# Patient Record
Sex: Female | Born: 1965
Health system: Southern US, Community
[De-identification: ages and names within clinical notes are randomized; demographics above are authoritative.]

## PROBLEM LIST (undated history)

## (undated) DIAGNOSIS — R102 Pelvic and perineal pain: Secondary | ICD-10-CM

## (undated) DIAGNOSIS — G8929 Other chronic pain: Secondary | ICD-10-CM

## (undated) DIAGNOSIS — T8859XA Other complications of anesthesia, initial encounter: Secondary | ICD-10-CM

## (undated) DIAGNOSIS — N3010 Interstitial cystitis (chronic) without hematuria: Secondary | ICD-10-CM

## (undated) DIAGNOSIS — M1990 Unspecified osteoarthritis, unspecified site: Secondary | ICD-10-CM

## (undated) DIAGNOSIS — Z9889 Other specified postprocedural states: Secondary | ICD-10-CM

## (undated) DIAGNOSIS — Z889 Allergy status to unspecified drugs, medicaments and biological substances status: Secondary | ICD-10-CM

## (undated) DIAGNOSIS — G5600 Carpal tunnel syndrome, unspecified upper limb: Secondary | ICD-10-CM

## (undated) DIAGNOSIS — T4145XA Adverse effect of unspecified anesthetic, initial encounter: Secondary | ICD-10-CM

## (undated) DIAGNOSIS — R112 Nausea with vomiting, unspecified: Secondary | ICD-10-CM

## (undated) DIAGNOSIS — F419 Anxiety disorder, unspecified: Secondary | ICD-10-CM

## (undated) HISTORY — PX: REDUCTION MAMMAPLASTY: SUR839

## (undated) HISTORY — PX: CYSTOSCOPY W/ DILATION OF BLADDER: SUR374

## (undated) HISTORY — PX: CYSTOSCOPY WITH HYDRODISTENSION AND BIOPSY: SHX5127

## (undated) HISTORY — PX: ABDOMINAL HYSTERECTOMY: SHX81

## (undated) HISTORY — PX: BREAST REDUCTION SURGERY: SHX8

## (undated) HISTORY — PX: DIAGNOSTIC LAPAROSCOPY: SUR761

## (undated) HISTORY — PX: KNEE ARTHROSCOPY: SHX127

## (undated) HISTORY — PX: ANKLE ARTHROSCOPY: SUR85

---

## 1998-06-02 ENCOUNTER — Emergency Department (HOSPITAL_COMMUNITY): Admission: EM | Admit: 1998-06-02 | Discharge: 1998-06-02 | Payer: Self-pay | Admitting: Internal Medicine

## 1998-06-15 ENCOUNTER — Other Ambulatory Visit: Admission: RE | Admit: 1998-06-15 | Discharge: 1998-06-15 | Payer: Self-pay | Admitting: Obstetrics and Gynecology

## 1998-10-18 ENCOUNTER — Ambulatory Visit (HOSPITAL_BASED_OUTPATIENT_CLINIC_OR_DEPARTMENT_OTHER): Admission: RE | Admit: 1998-10-18 | Discharge: 1998-10-18 | Payer: Self-pay | Admitting: Orthopaedic Surgery

## 1999-10-22 ENCOUNTER — Inpatient Hospital Stay (HOSPITAL_COMMUNITY): Admission: AD | Admit: 1999-10-22 | Discharge: 1999-10-25 | Payer: Self-pay | Admitting: Obstetrics and Gynecology

## 1999-10-26 ENCOUNTER — Encounter: Admission: RE | Admit: 1999-10-26 | Discharge: 2000-01-24 | Payer: Self-pay | Admitting: Obstetrics and Gynecology

## 1999-11-17 ENCOUNTER — Other Ambulatory Visit: Admission: RE | Admit: 1999-11-17 | Discharge: 1999-11-17 | Payer: Self-pay | Admitting: Obstetrics and Gynecology

## 2000-03-05 ENCOUNTER — Ambulatory Visit (HOSPITAL_BASED_OUTPATIENT_CLINIC_OR_DEPARTMENT_OTHER): Admission: RE | Admit: 2000-03-05 | Discharge: 2000-03-05 | Payer: Self-pay | Admitting: Orthopaedic Surgery

## 2000-11-01 ENCOUNTER — Encounter (INDEPENDENT_AMBULATORY_CARE_PROVIDER_SITE_OTHER): Payer: Self-pay

## 2000-11-01 ENCOUNTER — Inpatient Hospital Stay (HOSPITAL_COMMUNITY): Admission: RE | Admit: 2000-11-01 | Discharge: 2000-11-05 | Payer: Self-pay | Admitting: Obstetrics and Gynecology

## 2001-08-05 ENCOUNTER — Encounter: Admission: RE | Admit: 2001-08-05 | Discharge: 2001-08-05 | Payer: Self-pay | Admitting: Obstetrics and Gynecology

## 2001-08-05 ENCOUNTER — Encounter: Payer: Self-pay | Admitting: Obstetrics and Gynecology

## 2001-10-21 ENCOUNTER — Other Ambulatory Visit: Admission: RE | Admit: 2001-10-21 | Discharge: 2001-10-21 | Payer: Self-pay | Admitting: Obstetrics and Gynecology

## 2001-12-02 ENCOUNTER — Ambulatory Visit (HOSPITAL_BASED_OUTPATIENT_CLINIC_OR_DEPARTMENT_OTHER): Admission: RE | Admit: 2001-12-02 | Discharge: 2001-12-02 | Payer: Self-pay | Admitting: Surgery

## 2002-11-15 ENCOUNTER — Encounter: Payer: Self-pay | Admitting: Orthopaedic Surgery

## 2002-11-15 ENCOUNTER — Ambulatory Visit (HOSPITAL_COMMUNITY): Admission: RE | Admit: 2002-11-15 | Discharge: 2002-11-15 | Payer: Self-pay | Admitting: Orthopaedic Surgery

## 2002-11-18 ENCOUNTER — Other Ambulatory Visit: Admission: RE | Admit: 2002-11-18 | Discharge: 2002-11-18 | Payer: Self-pay | Admitting: Obstetrics and Gynecology

## 2003-05-07 ENCOUNTER — Ambulatory Visit (HOSPITAL_BASED_OUTPATIENT_CLINIC_OR_DEPARTMENT_OTHER): Admission: RE | Admit: 2003-05-07 | Discharge: 2003-05-07 | Payer: Self-pay | Admitting: Orthopaedic Surgery

## 2003-05-18 ENCOUNTER — Ambulatory Visit (HOSPITAL_BASED_OUTPATIENT_CLINIC_OR_DEPARTMENT_OTHER): Admission: RE | Admit: 2003-05-18 | Discharge: 2003-05-18 | Payer: Self-pay | Admitting: Urology

## 2003-12-15 ENCOUNTER — Other Ambulatory Visit: Admission: RE | Admit: 2003-12-15 | Discharge: 2003-12-15 | Payer: Self-pay | Admitting: Obstetrics and Gynecology

## 2004-04-28 ENCOUNTER — Ambulatory Visit (HOSPITAL_BASED_OUTPATIENT_CLINIC_OR_DEPARTMENT_OTHER): Admission: RE | Admit: 2004-04-28 | Discharge: 2004-04-28 | Payer: Self-pay | Admitting: Urology

## 2004-09-01 ENCOUNTER — Other Ambulatory Visit: Admission: RE | Admit: 2004-09-01 | Discharge: 2004-09-01 | Payer: Self-pay | Admitting: Obstetrics and Gynecology

## 2005-05-03 ENCOUNTER — Ambulatory Visit: Payer: Self-pay | Admitting: Internal Medicine

## 2005-07-26 ENCOUNTER — Other Ambulatory Visit: Admission: RE | Admit: 2005-07-26 | Discharge: 2005-07-26 | Payer: Self-pay | Admitting: Obstetrics and Gynecology

## 2005-11-20 ENCOUNTER — Ambulatory Visit (HOSPITAL_COMMUNITY): Admission: RE | Admit: 2005-11-20 | Discharge: 2005-11-20 | Payer: Self-pay | Admitting: Urology

## 2005-11-20 ENCOUNTER — Ambulatory Visit (HOSPITAL_BASED_OUTPATIENT_CLINIC_OR_DEPARTMENT_OTHER): Admission: RE | Admit: 2005-11-20 | Discharge: 2005-11-20 | Payer: Self-pay | Admitting: Urology

## 2006-01-08 ENCOUNTER — Ambulatory Visit: Payer: Self-pay | Admitting: Internal Medicine

## 2006-01-24 ENCOUNTER — Ambulatory Visit: Payer: Self-pay | Admitting: Internal Medicine

## 2006-06-10 ENCOUNTER — Ambulatory Visit: Payer: Self-pay | Admitting: Internal Medicine

## 2006-11-29 ENCOUNTER — Ambulatory Visit (HOSPITAL_BASED_OUTPATIENT_CLINIC_OR_DEPARTMENT_OTHER): Admission: RE | Admit: 2006-11-29 | Discharge: 2006-11-29 | Payer: Self-pay | Admitting: Urology

## 2006-11-29 ENCOUNTER — Ambulatory Visit: Payer: Self-pay | Admitting: Internal Medicine

## 2007-04-21 ENCOUNTER — Inpatient Hospital Stay (HOSPITAL_COMMUNITY): Admission: RE | Admit: 2007-04-21 | Discharge: 2007-04-23 | Payer: Self-pay | Admitting: Obstetrics and Gynecology

## 2007-05-30 ENCOUNTER — Ambulatory Visit: Payer: Self-pay | Admitting: Internal Medicine

## 2007-09-11 ENCOUNTER — Ambulatory Visit: Payer: Self-pay | Admitting: Internal Medicine

## 2007-11-04 ENCOUNTER — Ambulatory Visit (HOSPITAL_BASED_OUTPATIENT_CLINIC_OR_DEPARTMENT_OTHER): Admission: RE | Admit: 2007-11-04 | Discharge: 2007-11-04 | Payer: Self-pay | Admitting: Urology

## 2007-12-15 ENCOUNTER — Encounter: Admission: RE | Admit: 2007-12-15 | Discharge: 2007-12-15 | Payer: Self-pay | Admitting: Obstetrics and Gynecology

## 2008-03-15 ENCOUNTER — Emergency Department (HOSPITAL_COMMUNITY): Admission: EM | Admit: 2008-03-15 | Discharge: 2008-03-15 | Payer: Self-pay | Admitting: Emergency Medicine

## 2009-01-25 ENCOUNTER — Encounter: Admission: RE | Admit: 2009-01-25 | Discharge: 2009-01-25 | Payer: Self-pay | Admitting: Neurological Surgery

## 2009-04-19 ENCOUNTER — Encounter: Admission: RE | Admit: 2009-04-19 | Discharge: 2009-04-19 | Payer: Self-pay | Admitting: Obstetrics and Gynecology

## 2009-11-25 ENCOUNTER — Encounter: Admission: RE | Admit: 2009-11-25 | Discharge: 2009-11-25 | Payer: Self-pay

## 2010-03-29 ENCOUNTER — Ambulatory Visit: Payer: Self-pay

## 2010-05-30 ENCOUNTER — Encounter: Admission: RE | Admit: 2010-05-30 | Discharge: 2010-05-30 | Payer: Self-pay | Admitting: Gastroenterology

## 2011-05-01 NOTE — Assessment & Plan Note (Signed)
[facility]                             PULMONARY OFFICE NOTE   NAME:Kelly Garcia, Kelly Garcia                       MRN:          [id number]  DATE:05/30/2007                            DOB:          18-Feb-1966    PROBLEMS:  1. Allergic rhinitis.  2. Asthma.   HISTORY:  She comes for one year follow up continuing on allergy vaccine  with no problems. She strongly believes the vaccine helps her. Dose was  built to 0.3 ml per week of 1-10. Higher amount did cause some local  reaction. Her husband gives her shots. Today, I reviewed again risk and  benefit considerations, issues of administration outside of a medical  office, anaphylaxis, and epinephrine. We refilled her epinephrine  prescription. She indicated good understanding. Occasionally, she has  had a very brief self-limited episode of cough, especially in the  evenings before bed. We discussed whether this might be asthma,  aspiration, or some other event, but it is too infrequent and self-  limited to judge from her description.   MEDICATIONS:  1. Allergy vaccine.  2. Zyrtec.  3. Singulair.  4. Hydroxyzine for her bladder.  5. Neurontin.  6. BuSpar.  7. Nasonex.  8. Prozac.  9. Elmiron.   Drug intolerant DOXYCYCLINE, MORPHINE with itching, ALL NARCOTICS.   OBJECTIVE:  Weight 140 pounds, blood pressure 128/84, pulse 101, room  air saturation 98%. She seems calm and comfortable. Conjunctivae and  nasal airway are clear. Lungs are clear with good airflow. Heart sounds  are normal   IMPRESSION:  Allergic rhinitis and possible minimal intermittent asthma  under good control.   PLAN:  Educational discussion as above. Epinephrine refilled. Sample  albuterol inhaler 2 puffs daily p.r.n. She will watch the effect of this  and decide whether we need to keep her refilled. Schedule return one  year, earlier p.r.n.     Clinton D. Maple Hudson, MD, Tonny Bollman, FACP  Electronically Signed    CDY/MedQ  DD:  05/31/2007  DT: 06/01/2007  Job #: [id number]   cc:   Gwen Pounds, MD

## 2011-05-01 NOTE — Op Note (Signed)
Kelly Garcia, Kelly Garcia                ACCOUNT NO.:  [id number]   MEDICAL RECORD NO.:  [id number]          PATIENT TYPE:  AMB   LOCATION:  [facility]                         FACILITY:  [facility]   PHYSICIAN:  Jamison Neighbor, M.D.  DATE OF BIRTH:  [birth year]   DATE OF PROCEDURE:  11/04/2007  DATE OF DISCHARGE:                               OPERATIVE REPORT   PREOPERATIVE DIAGNOSES:  1. Interstitial cystitis.  2. Urgency incontinence.   POSTOPERATIVE DIAGNOSES:  1. Interstitial cystitis.  2. Urgency incontinence.   PROCEDURE:  Cystoscopy, urethral calibration, hydrodistention of the  bladder, Marcaine and Pyridium installation, Marcaine and Kenalog  injection, Botox injection.   SURGEON:  Jamison Neighbor, M.D.   ANESTHESIA:  General.   COMPLICATIONS:  None.   DRAINS:  None.   BRIEF HISTORY:  This 45 year old female has voiding dysfunction as well  as interstitial cystitis as evidenced by previous cystoscopy and  hydrodistention.  The patient has had a worsening of her symptoms, has  requested repeat hydrodistention.  We have had some discussion about  adding a Botox injection to try to stop her very difficult to control  frequency and urgency.  The patient gave some serious thought to that  and decided to have a Botox injection.  She is aware of the potential  risk for retention problems but feels that she will be able to handle  this if that happens.  She gave full informed consent.   PROCEDURE:  After successful induction of general anesthesia, the  patient was placed in the dorsal lithotomy position, prepped with  Betadine and draped in the usual sterile fashion.  Careful bimanual  examination revealed no cystocele, rectocele or enterocele.  There were  no masses on bimanual exam.  Urethra was calibrated 32-French with  female urethral sounds with no evidence of stenosis or stricture.  The  cystoscope was inserted.  The bladder was carefully inspected.  No  tumors or stones could  be seen.  The bladder had a slightly trabeculated  look as if the patient had been pushing against some resistance and  clearly we will want to explore with her further the possibility of  ongoing pelvic floor rehabilitation or possible use of Valium  suppositories.  The bladder was filled.  The patient had a pressure of  100 cm of water.  She had bladder capacity after anesthetic dilation of  600 mL with no bleeding.  The patient underwent Botox injection with  total of two ampules, she then had Marcaine and Pyridium instilled the  bladder.  Marcaine and Kenalog were injected periurethrally as a  pudendal block.  The patient tolerated procedure  well, was taken to recovery in good condition.  She will be sent home  with Lorcet Plus, Pyridium Plus and doxycycline and return to see me in  follow-up.  If she is unable to urinate, she will be taught in-and-out  catheterization or given a short course of an indwelling catheter.      Jamison Neighbor, M.D.  Electronically Signed     RJE/MEDQ  D:  11/04/2007  T:  11/04/2007  Job:  [id number]

## 2011-05-04 NOTE — Op Note (Signed)
NAMELILOU, KNEIP                          ACCOUNT NO.:  [id number]   MEDICAL RECORD NO.:  [id number]                   PATIENT TYPE:  AMB   LOCATION:  [facility]                                  FACILITY:  [facility]   PHYSICIAN:  Lubertha Basque. Jerl Santos, M.D.             DATE OF BIRTH:  1966-02-07   DATE OF PROCEDURE:  05/07/2003  DATE OF DISCHARGE:                                 OPERATIVE REPORT   PREOPERATIVE DIAGNOSIS:  Right knee torn medial meniscus.   POSTOPERATIVE DIAGNOSIS:  Right knee torn medial meniscus.   PROCEDURE:  1. Right knee partial meniscectomy.  2. Right knee arthroscopic chondroplasty.   SURGEON:  Lubertha Basque. Jerl Santos, M.D.   ASSISTANT:  Lindwood Qua, P.A.   INDICATIONS:  The patient is a 45 year old woman who is about 5 years out  from ACL reconstruction where at which point a medial meniscus repair was  performed.  Unfortunately, she was shopping a few days ago, when she came  down off a shelf and twisted her knee.  She felt a pop and has had extreme  pain since that time.  An MRI scan was done two days ago which showed a  displaced medial meniscus tear with an intact ACL reconstruction graft.  She  is offered an arthroscopy at this point for a locked knee which was stuck in  less than full extension.  Informed operative consent was obtained after  being advised of possible complications of, reaction to anesthesia and  infection.   DESCRIPTION OF PROCEDURE:  The patient was taken to the operative suite  where general anesthetic was applied without difficulty.  She was positioned  supine, prepped and draped in the normal sterile fashion.  After the  administration of preoperative antibiotics, an arthroscopy of the right knee  was performed through two inferior portals.  Suprapatellar fossa was benign  as was the patellofemoral joint.  She had mild chondromalacia addressed with  a brief chondroplasty.  In the medial compartment, she did have a displaced  locked  bucket handle tear which appeared to have occurred at her old repair  site.  I elective to remove this portion of her meniscus taking about 50% of  the medial meniscus in the process.  The ACL graft appeared intact.  The PCL  was intact.  Lateral compartment revealed no evidence of meniscal or  articular cartilage injury.  There were no significant degenerative changes  in the medial compartment.  The knee was thoroughly irrigated at the end of  the case followed by placement of Marcaine with epinephrine and morphine.  Adaptic was placed over the portals followed by dry gauze and a loose Ace  wrap.  Estimated blood loss and intraoperative fluids can be obtained from  the anesthesia records.   DISPOSITION:  The patient was extubated in the operating room and taken to  the recovery room stable.  Plans were for her  to stay overnight for pain  control and probable discharge home in the morning.                                               Lubertha Basque Jerl Santos, M.D.    PGD/MEDQ  D:  05/07/2003  T:  05/07/2003  Job:  [id number]

## 2011-05-04 NOTE — Op Note (Signed)
[facility]  Patient:    Kelly Garcia, Kelly Garcia                       MRN: [id number] Proc. Date: 03/05/00 Adm. Date:  [id number] Attending:  Marcene Corning                           Operative Report  PREOPERATIVE DIAGNOSIS:  Right knee torn medial meniscus.  POSTOPERATIVE DIAGNOSIS: 1. Right knee torn medial meniscus. 2. 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  OPERATION PERFORMED: 1. Right knee medial meniscus repair. 2. Right knee chondroplasty patella and removal of loose bodies.  ANESTHESIA:  General.  ATTENDING SURGEON:  Lubertha Basque. Jerl Santos, M.D.  ASSISTANT:  Lindwood Qua, P.A.  INDICATIONS FOR PROCEDURE:  The patient is a 45 year old woman who is about a year and a half from right anterior cruciate ligament reconstruction repeat.  She has persisted with medial jointline pain for eight or nine months.  At one point she had an effusion.  At this point she was offered operative intervention to consist of an arthroscopy.  The procedure was discussed with the patient and informed operative consent was obtained after discussion of possible complications of reaction to anesthesia and infection.  DESCRIPTION OF PROCEDURE:  The patient was taken to an operating suite where general anesthetic was applied without difficulty.  She was then positioned supine and prepped and draped in normal sterile fashion.  After administration of preop intravenous antibiotics, an arthroscopy of the right knee was performed through two inferior portals.  Suprapatellar pouch was benign except for some cartilaginous  loose bodies which were removed.  Her patellofemoral joint exhibited some lateral tracking with some chondromalacia of the medial patellar facet.  This was addressed with a thorough chondroplasty.  Both gutters were notable for some cartilaginous loose bodies which were removed.  The notch showed an intact ACL reconstruction  with  an intact PCL as well.  The lateral compartment exhibited no evidence of meniscal or articular cartilage injury.  In the medial compartment, she had a meniscus tear involving the middle  and a portion of the posterior horn.  This as at the red-white junction and was displaceable.  This was full thickness from the superior to the inferior aspect of the meniscus.  It was felt best to repair this especially in someone who was status post ACL reconstruction with some mild residual laxity.  As a result, we placed a shaver in the back along with an abrader to freshen the area and encourage some bleeding.  We used a zone specific cannula to place three sutures under arthroscopic guidance through an additional posteromedial 1 cm incision.  These were all passed out through this incision and then tied over the capsule in the posteromedial aspect of the knee joint.  This  reapproximated the meniscus to the bleeding capsule.  We then thoroughly irrigated the knee.  On repeat examination, the meniscus could not be displaced and appeared to be in good position for healing.  The knee was irrigated once again at the end of the case followed by placement of Marcaine with epinephrine and morphine. Simple sutures of nylon were used to reapproximate the posteromedial incisions.  Adaptic was placed over all wounds followed by dry gauze and a loose Ace wrap. A knee immobilizer was also applied with the knee in extension.  Estimated blood oss and intraoperative fluids can be obtained  from Anesthesia records.  DISPOSITION:  The patient was extubated in the operating room and taken to the recovery room in stable condition.  Plans were for her to go home the same day nd to follow up in the office in less than a week.  I will contact her by phone tonight. DD:  03/05/00 TD:  03/05/00 Job: [id number]

## 2011-05-04 NOTE — Op Note (Signed)
[name], Kelly Garcia                ACCOUNT NO.:  [id number]   MEDICAL RECORD NO.:  [id number]          PATIENT TYPE:  AMB   LOCATION:  [facility]                           FACILITY:  [facility]   PHYSICIAN:  Michelle L. Grewal, M.D.DATE OF BIRTH:  1966-04-10   DATE OF PROCEDURE:  04/21/2007  DATE OF DISCHARGE:                               OPERATIVE REPORT   PREOPERATIVE DIAGNOSIS:  Pelvic pain, interstitial cystitis, and desires  scar revision.   POSTOPERATIVE DIAGNOSIS:  Pelvic pain, interstitial cystitis, and  desires scar revision.   PROCEDURE:  Is exploratory laparotomy and lysis of adhesions.   SURGEON:  Dr. Vincente Poli   ANESTHESIA:  General.   ESTIMATED BLOOD LOSS:  Minimal.   PROCEDURE:  Patient taken to the operating room after informed consent  is obtained.  She was initially taken to the operating room by Dr.  Benna Dunks. She is intubated.  She is prepped and draped by Dr. Benna Dunks. He  then made the initial skin incision down to the fascia. At that point I  scrubbed in and I made a small low transverse incision in the fascia and  scored it laterally and divided the rectus muscles in the midline and  the peritoneum bluntly. I then placed two packs in the upper abdomen,  pushed the bowel in the upper abdomen so that I could palpate inspect  the pelvis.  The ovaries were very small. She is status post  hysterectomy and of course there were no adhesions to the vaginal cuff.  The left ovary was adherent slightly to the peritoneum, was high up in  the pelvis and so I did loosen that up so that it was more free.  No  adhesions to the vaginal cuff or the bladder which was good.  We removed  the laparotomy pads, closed the peritoneum using 0 Vicryl and then  reapproximated in the rectus muscles using 0 Vicryl.  I then closed the  fascia using 0 Vicryl running stitch and then at that point I scrubbed  out and Dr. Benna Dunks completed his procedure. All sponge, lap and  instrument counts were correct  x2.  The patient went to recovery room in  stable condition.      Michelle L. Vincente Poli, M.D.  Electronically Signed     MLG/MEDQ  D:  04/21/2007  T:  04/21/2007  Job:  [id number]

## 2011-05-04 NOTE — Op Note (Addendum)
Kelly Garcia, DARNOLD                          ACCOUNT NO.:  [id number]   MEDICAL RECORD NO.:  [id number]                   PATIENT TYPE:  AMB   LOCATION:  [facility]                                 FACILITY:  [facility]   PHYSICIAN:  Jamison Neighbor, M.D.               DATE OF BIRTH:  November 09, 1966   DATE OF PROCEDURE:  05/18/2003  DATE OF DISCHARGE:                                 OPERATIVE REPORT   PREOPERATIVE DIAGNOSIS:  Interstitial cystitis.   POSTOPERATIVE DIAGNOSIS:  Interstitial cystitis.   PROCEDURE:  Cystoscopy, urethral calibration, hydrodistention of bladder,  Marcaine __________, Marcaine and Kenalog injection   SURGEON:  Jamison Neighbor, M.D.   ANESTHESIA:  General.   COMPLICATIONS:  None.   DRAINS:  None.   BRIEF HISTORY:  This is a 45 year old female who has severe painful bladder  consistent with interstitial cystitis.  The patient has responded nicely to  hydrodistention in the past and has requested this be repeated.  She is  aware of the fact that there is no guarantee that this will work for her as  well as it has in the past and she feels that she has now responded to oral  therapy as well as she had in the past and she has not responded to  intravesical therapy and for that reason she has requested hydrodistention.  She understands all the risks and benefits of the procedure and gave full  and informed consent.   DESCRIPTION OF PROCEDURE:  After the successful induction of general  anesthesia, the patient was placed in the dorsal lithotomy position and  prepped with Betadine and draped in the usual sterile fashion.  Bimanual  examination revealed no abnormalities of the urethra, bladder base or  vaginal contents.  There were no adnexal masses, there was no cystocele,  rectocele, enterocele, or urethral diverticulum.  The urethra was calibrated  to 74 [place] with urethral sounds with no stenosis or stricture identified.  The cystoscope was inserted and the  bladder was carefully inspected.  It was  free of any tumor or stones and both ureteral orifices were normal in  configuration and location.  The bladder was then distended at a pressure of  100 cm of water for five minutes and when the bladder was drained, very  little in the way of _________was seen.  This was in marked contrast to  previous hydrodistentions and would suggest that there has been a good  response to the Elmiron therapy.  The bladder capacity was 850 cc which is  diminished when compared with a normal capacity of 1150 and an interstitial  cystitis average of 575.  The bladder was drained.  A mixture of Marcaine  and __________ was left in the bladder, Marcaine and Kenalog were injected  periurethrally as well as a bilateral pudendal block.  The patient received  intraoperative Toradol and Zofran as well as a ____  QA MARKER: 123                                              ____suppository.  She will be sent home with Lorcet 10, Pyri  Levaquin, and return to see me in followup in two to three weeks' time.                                              Jamison Neighbor, M.D.   RJE/MEDQ  D:  05/18/2003  T:  05/18/2003  Job:  [id number]

## 2011-05-04 NOTE — Op Note (Signed)
[facility]  Patient:    Kelly Garcia, Kelly Garcia Visit Number: [id number] MRN: [id number]          Service Type: NES Location: [facility] Attending Physician:  Londell Moh Dictated by:   Jamison Neighbor, M.D. Proc. Date: 12/02/01 Admit Date:  12/02/2001                             Operative Report  SERVICE:  Urology.  PREOPERATIVE DIAGNOSIS:  Interstitial cystitis.  POSTOPERATIVE DIAGNOSIS:  Interstitial cystitis.  PROCEDURE:  Cystoscopy, urethral calibration, hydrodistention of the bladder, Marcaine and Pyridium installation, Marcaine and Kenalog injection.  SURGEON:  Jamison Neighbor, M.D.  COMPLICATIONS:  None.  DRAINS:  None.  BRIEF HISTORY:  This 45 year old female was known to have interstitial cystitis. The patient has responded beautifully to hydrodistention in the past and feels that this is not only a diagnostic but a therapeutic maneuver for her. The patients symptoms have flared up and she has not responded to other forms of therapy and has requested a repeat hydrodistention be performed. The patient understands that there is no guarantee that she will have a decrease in her symptoms and that specifically she may have increased urgency, frequency, and pain for at least one to two weeks postoperatively. The patient gave full and informed consent.  DESCRIPTION OF PROCEDURE:  After successful induction of general anesthesia, the patient was placed in the dorsal lithotomy position, prepped with Betadine and draped in the usual sterile fashion. Careful bimanual examination revealed no cystocele, rectocele, or enterocele. There are no masses on bimanual exam. The patient had no evidence of a urethral diverticulum. The patient is status post hysterectomy. The urethra was calibrated at 87 [place] with female urethral sounds with no signs of stenosis or stricture. The cystoscope was inserted, the bladder was carefully inspected and was free of  any tumor or stones. Both ureteral orifices were normal in configuration and location. The bladder was then distended at a pressure of 100 cm of water for 5 minutes. When the bladder was drained, the patient had glomerulations scattered throughout the bladder. The bladder capacity was slightly diminished at 800 cc consistent with a moderate case of interstitial cystitis. The patient does not require biopsy at this time. The bladder was drained, a mixture of Marcaine and Pyridium was instilled in the bladder, Marcaine and Kenalog were injected periurethrally. The patient was given a B&O suppository and was given intraoperative Toradol and Zofran. She will be given appropriate pain medication in the postoperative period and will be sent home with lorcet plus, Pyridium plus and Levaquin. She will be seen in the office in two to three weeks time. Dictated by:   Jamison Neighbor, M.D. Attending Physician:  Londell Moh DD:  12/02/01 TD:  12/02/01 Job: [id number]

## 2011-05-04 NOTE — Op Note (Signed)
Kelly Garcia, Kelly Garcia                ACCOUNT NO.:  [id number]   MEDICAL RECORD NO.:  [id number]          PATIENT TYPE:  AMB   LOCATION:  [facility]                         FACILITY:  [facility]   PHYSICIAN:  Jamison Neighbor, M.D.  DATE OF BIRTH:  1966-08-06   DATE OF PROCEDURE:  11/29/2006  DATE OF DISCHARGE:                               OPERATIVE REPORT   PREOPERATIVE DIAGNOSIS:  Interstitial cystitis.   POSTOPERATIVE DIAGNOSIS:  Interstitial cystitis.   PROCEDURES:  1. Cystoscopy.  2. Urethral calibration.  3. Hydrodistention of the bladder.  4. Marcaine and Pyridium installation.  5. Marcaine and Kenalog injection.   SURGEON:  Jamison Neighbor, M.D.   ANESTHESIA:  General.   COMPLICATIONS:  None.   DRAINS:  None.   BRIEF HISTORY:  This 45 year old female has problems with urgency  incontinence secondary to interstitial cystitis.  She has severe and  chronic pelvic pain; and requires a repeat hydrodistention.  The patient  has been a major flare since she underwent some manipulation of her  Pfannenstiel incision by a massage therapist.  This caused some real  exacerbation of her bladder pain; and it very difficult to control.  She  is now to undergo cystoscopy and hydrodistention.  She understands the  risks and benefits of the procedure and gave full and informed consent.   DESCRIPTION OF PROCEDURE:  After successful induction of general  anesthesia the patient was placed in the dorsal position, prepped with  Betadine and draped in the usual sterile fashion.  Careful bimanual  examination revealed an unremarkable urethra. It was pre well-supported.  There was a minimal cystocele, no real rectocele or enterocele; and no  significant vault prolapse.  The urethra was calibrated to 32-French  with female urethral sounds with no evidence of stenosis or stricture.  The cystoscope was inserted, the bladder was carefully inspected.  Both  ureteral orifices were normal in configuration  and location.  There was  pretty significant trabeculation consistent with some ongoing pelvic  floor obstruction, but there was no evidence of diverticular formation.  Hydrodistention of the bladder was performed.  The bladder was distended  at a pressure of 100 cm of water for 5 minutes.  When the bladder was  drained.  A minimal number of glomerulations could be seen.  This was a  better appearance than what I had been seeing previously, but the  patient did have a bladder capacity of just over 600 mL.  This compares  to a normal bladder capacity of 1150; and an average IC bladder capacity  of approximately 575.  The bladder was drained.  A mixture of Marcaine  and Pyridium was left within the bladder.  A mixture of Marcaine and  Kenalog was  injected periurethrally.  The patient tolerated the procedure well and  was taken to the recovery room in good condition.  She will be sent home  with Lorcet Plus, Pyridium Plus and Macrobid.  She will return to see me  in several weeks time.           ______________________________  Jamison Neighbor,  M.D.  Electronically Signed     RJE/MEDQ  D:  11/29/2006  T:  11/29/2006  Job:  [id number]

## 2011-05-04 NOTE — Op Note (Signed)
[facility]  Patient:    Kelly Garcia, Kelly Garcia                         MRN: [id number] Proc. Date: 11/01/00 Attending:  Marcelle Overlie, M.D.                           Operative Report  PREOPERATIVE DIAGNOSES:       1. Chronic cervicitis.                               2. Pelvic pain.  POSTOPERATIVE DIAGNOSES:      1. Chronic cervicitis.                               2. Pelvic pain.  PROCEDURE:                    Total vaginal hysterectomy.  SURGEON:                      Marcelle Overlie, M.D.  ASSISTANT:                    Luvenia Redden, M.D.  ANESTHESIA:                   General anesthesia.  ESTIMATED BLOOD LOSS:         50 cc.  FINDINGS:                     A small uterus with normal ovaries.  COMPLICATIONS:                None.  PATHOLOGY:                    Uterus and cervix.  DESCRIPTION OF PROCEDURE:     The patient is taken to the operating room.  She was intubated without difficulty.  The patient was placed in a high lithotomy position.  The vagina and vulva were prepped and draped in the usual sterile fashion.  In and out catheter was used to empty the bladder.  A speculum was placed in the vagina.  The cervix was grasped with the tenaculum and the cervix was infiltrated paracervically.  A circumferential incision was then made with the electrocautery and the overlying vaginal epithelium was then pushed away anteriorly and laterally and posteriorly.  The posterior cul-de-sac was visualized and entered sharply using the Mayo scissors.  The posterior peritoneum was then fixed to the posterior cul-de-sac using a stitch,  using #0 Vicryl suture.  We then turned out attention anteriorly and used a sharp dissection.  The anterior cul-de-sac was then entered without difficulty, and using a Heaney clamp, the uterosacral and Cardinal ligaments were then clamped initially on the left side, and the pedicles were cut and suture ligated using #0  Vicryl sutures.  In a likewise fashion the uterosacral and Cardinal ligament complex was then clamped on the right side using a Heaney clamp.  The pedicles were cut and suture ligated using #0 Vicryl suture.  The uterine arteries were then subsequently clamped on each side using Heaney clamps, and the pedicles were cut and suture ligated using #0 Vicryl suture, then progressively clamped along the broad ligament using Heaney clamps.  Each pedicle was cut and suture ligated using #0 Vicryl suture.  Once we had reached the level of the triple pedicle, the uterus was then retroflexed and a Heaney clamp was placed across each triple pedicle quite easily.  The pedicles were then cut and then secured using a suture ligature of #0 Vicryl suture, as well as a free tie of #0 Vicryl suture.  All pedicles were inspected and noted to be hemostatic.  The cul-de-sac was noted to be a little deep, and so a McCall culdoplasty was performed using #0 Vicryl sutures, starting at one vaginal corner, and then incorporating the uterosacrals and the peritoneum from the cul-de-sac.  The cuff was then closed using #0 Vicryl suture in a continuous running locking fashion, and a cul-de-sac stitch was tied at the end.  A Foley catheter was placed at the end of the procedure and it was noted that there was clear urine draining from the Foley catheter.  All sponge, lap, and instrument counts were correct x 2.  The patient tolerated the procedure well and was removed to the recovery room in stable condition. DD:  11/01/00 TD:  11/01/00 Job: [id number]

## 2011-05-04 NOTE — Discharge Summary (Signed)
NAMEMIKENZIE, Kelly Garcia                ACCOUNT NO.:  [id number]   MEDICAL RECORD NO.:  [id number]          PATIENT TYPE:  INP   LOCATION:  9315                          FACILITY:  [facility]   PHYSICIAN:  Kelly Garcia, M.D.DATE OF BIRTH:  November 29, 1966   DATE OF ADMISSION:  04/21/2007  DATE OF DISCHARGE:  04/23/2007                               DISCHARGE SUMMARY   ADMISSION DIAGNOSIS:  Pelvic pain and unsightly scar from previous C-  section incision   HOSPITAL COURSE:  The patient is a 45 year old female who was admitted  for minilaparotomy and revision of C-section scar.  She has a history of  chronic pelvic pain and significant interstitial for cystitis.  On the  day of surgery, the patient underwent her exploratory laparotomy and  lysis of adhesions and scar revision.  EBL was minimal.  She had some  pain issues postop, mostly likely because of her history of interstitial  cystitis.  Her hemoglobin on postop day #1 was 11.5.  She went home on  postop day #2 in good condition.  She was given Dilaudid to take as  needed for pain control.  She will follow up with Dr. Benna Dunks for her  postop visit and follow up with me in one week.  She was advised no  heavy lifting for 4-6 weeks, no sex for 2-3 weeks, no driving for one  week and to call if she has any nausea, vomiting, abdominal pain,  temperature greater than 100.5 or redness or drainage from incision  site.      Kelly L. Vincente Poli, M.D.  Electronically Signed     MLG/MEDQ  D:  06/07/2007  T:  06/08/2007  Job:  [id number]

## 2011-05-04 NOTE — Op Note (Signed)
[facility]  PatientSARANYA Garcia                        MRN: [id number] Proc. Date: 10/22/99 Adm. Date:  [id number] Attending:  Marcelle Overlie                           Operative Report  PREOPERATIVE DIAGNOSIS:       Intrauterine pregnancy at 37-1/7 weeks.  Previous  cesarean section.  Spontaneous rupture of membranes.  POSTOPERATIVE DIAGNOSIS:      Intrauterine pregnancy at 37-1/7 weeks.  Previous  cesarean section.  Spontaneous rupture of membranes.  OPERATION:                    Repeat low transverse cesarean section.  SURGEON:                      Marcelle Overlie, M.D.  ASSISTANT:  ANESTHESIA:                   Spinal.  ESTIMATED BLOOD LOSS:         500 cc.  FINDINGS:                     Vigorous female infant with Apgars of 9 at one minute and 9 at five minutes weighing 7 pounds 4 ounces.  DESCRIPTION OF PROCEDURE:     The patient was taken to the operating room where she was given a spinal without difficulty.  She was then placed in the supine position with a dorsal leftward tilt.  The abdomen was prepped and draped in the usual sterile fashion after a Foley catheter was placed in the bladder.  Using a scalpel, a low transverse incision was made and carried down to the fascia using electrocautery with good hemostasis.  The fascia was scored in the midline and hen extended laterally using Mayo scissors.  The fascia was then grasped and a Pfannenstiel incision was created by dissecting the rectus muscles away superiorly and then inferiorly with good hemostasis.  The midline was identified, the rectus muscles were separated.  The peritoneum was picked up and entered bluntly. There were no adhesions to the anterior abdominal wall.  The peritoneal incision was hen stretched and then extended.  The bladder blade was inserted.  The lower uterine segment was identified.  The bladder flap was created sharply and  then digitally and the bladder blade was readjusted.  Using the scalpel, a low transverse incision was made in the uterus and then extended laterally using Mayo scissors.  There as clear amniotic fluid noted upon entry into the uterine cavity.  The baby was in the cephalic presentation and was delivered with the assistance of vacuum extractor. It was a vigorous female with Apgars of 9 at one minute and 9 at five minutes. It weighed 7 pounds 4 ounces.  The cord was clamped and cut.  The baby was handed o the awaiting pediatricians and subsequently taken to the newborn nursery.  Cord  blood was obtained and the placenta was manually removed and noted to be intact. The uterus was then cleared of all clots and debris.  The uterine incision was closed in a single layer using 0 chromic in a continuous running locking stitch and was noted to be hemostatic.  The adnexa were palpated and noted to be normal.  he peritoneum was closed in a single layer using 0 Vicryl in a continuous running stitch.  The rectus muscles were reapproximated using the same 0 Vicryl.  After  noting that the rectus muscles were hemostatic, the fascia was close dusing 0 Vicryl starting at each corner using a continuous running stitch and meeting in the midline.  After irrigation of the subcutaneous layer, the skin was closed with staples.  Sponge, needle, and instrument counts were correct x 2.  The patient tolerated the procedure well and went to the recovery room in stable condition.  Drains Foley.  Pathology none. DD:  10/22/99 TD:  10/23/99 Job: [id number]

## 2011-05-04 NOTE — Op Note (Signed)
NAMEAUNDRIA, BITTERMAN                ACCOUNT NO.:  [id number]   MEDICAL RECORD NO.:  [id number]          PATIENT TYPE:  AMB   LOCATION:  NESC                         FACILITY:  [facility]   PHYSICIAN:  Jamison Neighbor, M.D.  DATE OF BIRTH:  [birth year]   DATE OF PROCEDURE:  11/20/2005  DATE OF DISCHARGE:                                 OPERATIVE REPORT   SERVICE:  Urology.   PREOPERATIVE DIAGNOSES:  Interstitial cystitis.   POSTOPERATIVE DIAGNOSES:  Interstitial cystitis.   PROCEDURE:  Cystoscopy, urethral calibration, hydrodistention of the  bladder, Marcaine and Pyridium installation, Marcaine and Kenalog injection.   SURGEON:  Jamison Neighbor, M.D.   ANESTHESIA:  General.   COMPLICATIONS:  None.   DRAINS:  None.   BRIEF HISTORY:  This 45 year old female has known interstitial cystitis. She  is now to undergo hydrodistention. She has had good responses in the past.  She understands there is no guarantee she will have the same improvement  that she has had before. She gave full informed consent.   DESCRIPTION OF PROCEDURE:  After successful induction of general anesthesia,  the patient was placed in the dorsal lithotomy position, prepped with  Betadine and draped in the usual sterile fashion. Careful bimanual  examination revealed no significant cystocele, rectocele or enterocele.  There were no masses on bimanual exam. The patient's urethra was of normal  caliber accepting a 27 [place] female urethral sound with no evidence of  stenosis or stricture. The cystoscope was inserted, the bladder was  carefully inspected and was free of any tumor or stones. Both ureteral  orifices were normal in configuration and location. Hydrodistention of the  bladder was then performed. The bladder was distended at a pressure of 100  cmH2O for 5 minutes and when the bladder was drained glomerulations were  seen throughout the bladder. The bladder capacity was 700 mL, no ulcers  could be seen, no  other abnormalities were detected. The bladder was  drained, the patient tolerated the procedure well. She received Marcaine and  Pyridium installation, Marcaine and Kenalog injection. She will return to  see me in followup. She will be sent home with pain medication, Pyridium  plus and antibiotic coverage.          ______________________________  Jamison Neighbor, M.D.  Electronically Signed    RJE/MEDQ  D:  11/20/2005  T:  11/20/2005  Job:  [id number]
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
## 2011-05-04 NOTE — Discharge Summary (Signed)
[facility]  Patient:    Kelly Garcia, Kelly Garcia                       MRN: [id number] Adm. Date:  [id number] Disc. Date: [id number] Attending:  Marcelle Overlie                           Discharge Summary  ADMISSION DIAGNOSES:          Chronic cervicitis and pelvic pain.  DISCHARGE DIAGNOSES:          Chronic cervicitis and pelvic pain, status post total vaginal hysterectomy.  HOSPITAL COURSE:              Patient is a 45 year old gravida 3, para 2 with chronic cervicitis and pelvic pain.  She also complains of heavier menses since the birth of her last child.  She was treated with antibiotics numerous times and cryo without any relief.  Patient did not want to try further antibiotics.  She also complains of severe dyspareunia.  Patient was admitted, underwent a total vaginal hysterectomy.  EBL at the time of surgery was 50 cc. Patient did very well immediately postoperative.  By postoperative day #2 was tolerating a regular diet, ambulating.  Did develop a postoperative temperature of 102.1 on postoperative day #1-2.  Her white blood cell count remained normal.  On the day after surgery it was 6.8.  Her white blood cell count on postoperative day #2 was 7.9 and postoperative day #3 was 8.1. Patients hematocrit immediately postoperative on postoperative day #1 was 31.2 and remained stable throughout hospitalization.  It was felt the patients postoperative temperature was secondary to pelvic cellulitis and patient was started on antibiotics and within 48 hours of antibiotics had defervesced and was feeling much better.  She went home on postoperative day #4.  She was given Augmentin 250 mg p.o. t.i.d. for seven days as well as a prescription of Tylox and Motrin.  She will follow up in the office in four weeks.  She was advised to call if she has a temperature greater than 100.5, any nausea, vomiting, vaginal drainage, or increasing abdominal pain.  She was in  good condition at the time of discharge.  She was advised no driving for two to three weeks and no heavy lifting for four weeks. DD:  12/18/00 TD:  12/18/00 Job: [id number]

## 2011-05-04 NOTE — Op Note (Signed)
Kelly Garcia, Kelly Garcia                ACCOUNT NO.:  [id number]   MEDICAL RECORD NO.:  [id number]          PATIENT TYPE:  AMB   LOCATION:  SDC                           FACILITY:  [facility]   PHYSICIAN:  Alfredia Ferguson, M.D.  DATE OF BIRTH:  [birth year]   DATE OF PROCEDURE:  DATE OF DISCHARGE:                               OPERATIVE REPORT   PREOPERATIVE DIAGNOSIS:  Unsightly scar from previous C-section  incision.   POSTOPERATIVE DIAGNOSIS:  Unsightly scar from previous C-section  incision.   OPERATION PERFORMED:  Scar revision previous C-sections incision.   SURGEON:  Alfredia Ferguson, M.D.   ANESTHESIA:  General endotracheal anesthesia.   INDICATIONS FOR SURGERY:  This is a 45 year old woman who is undergoing  examination of the ovaries by Dr. Vincente Poli today.  This patient has asked  me to revise her C-section scar at the same time that Dr. Vincente Poli is  doing her GYN work.  The plan is to the simply excise the scar with a  small amount of residual skin and reclose the skin incision following  Dr. Lynnell Dike procedure.  The patient understands the risk of unsightly  scarring, bleeding, infection, the need for touch-up surgery and overall  dissatisfaction with results.  In spite of this potential risk, the  patient wishes to proceed with the surgery.   DESCRIPTION OF OPERATION:  With the patient standing skin marks were  placed outlining the area of the scar.  The patient was taken to the OR.  She was placed in the supine position with all pressure points inspected  and padded.  The patient was given general endotracheal anesthesia.  Her  abdomen was prepped with Betadine and draped with sterile drapes.  An  incision was made around the existing C-section scar which was slightly  depressed.  The lower limb of my incision was deepened until reaching  the anterior abdominal wall fascia.  It was noted during this dissection  that the scar was very densely adherent to the abdominal wall fascia.  The skin was elevated off of the abdominal wall fascia for distance of  several centimeters in the superior direction.  I excised approximately  3 cm of skin along with the scar.  Meticulous hemostasis was  accomplished throughout.  Dr. Vincente Poli came into the operating at this  time and proceed with her work.  Once she closed the abdominal wall  fascia, the abdominal wound was irrigated with saline.  Incision was  closed by me by approximating Scarpa fascia with interrupted 2-0 Vicryl  suture.  Dermis was closed with interrupted 3-0 Monocryl suture.  The  skin edges united with a running 3-0 subcuticular of 3-0 Monocryl.  The  patient tolerated procedure well with minimal bleeding.  Her abdomen was  cleansed and dried.  Steri-Strips were applied followed by a light  dressing.  The patient was awakened, extubated and transferred to  recovery room in satisfactory condition.  Apr 20, 2007      W. Delia Chimes, M.D.  Electronically Signed    WBB/MEDQ  D:  04/21/2007  T:  04/21/2007  Job:  [id number]

## 2011-08-23 ENCOUNTER — Other Ambulatory Visit: Payer: Self-pay | Admitting: Obstetrics and Gynecology

## 2011-08-23 DIAGNOSIS — R928 Other abnormal and inconclusive findings on diagnostic imaging of breast: Secondary | ICD-10-CM

## 2011-08-29 ENCOUNTER — Ambulatory Visit
Admission: RE | Admit: 2011-08-29 | Discharge: 2011-08-29 | Disposition: A | Discharge status: Home / Self Care | Payer: BC Managed Care – PPO | Source: Ambulatory Visit | Attending: Obstetrics and Gynecology | Admitting: Obstetrics and Gynecology

## 2011-08-29 DIAGNOSIS — R928 Other abnormal and inconclusive findings on diagnostic imaging of breast: Secondary | ICD-10-CM

## 2011-09-10 LAB — COMPREHENSIVE METABOLIC PANEL
ALT: 39 — ABNORMAL HIGH
AST: 41 — ABNORMAL HIGH
Albumin: 4
Alkaline Phosphatase: 54
Calcium: 9.8
GFR calc Af Amer: >60
Glucose, Bld: 94
Potassium: 3.7
Sodium: 139
Total Protein: 7.4

## 2011-09-10 LAB — URINALYSIS, ROUTINE W REFLEX MICROSCOPIC
Glucose, UA: NEGATIVE
Hgb urine dipstick: NEGATIVE
Ketones, ur: NEGATIVE
Protein, ur: NEGATIVE
pH: 6.5

## 2011-09-10 LAB — DIFFERENTIAL
Basophils Relative: 0
Eosinophils Absolute: 0.1
Eosinophils Relative: 1
Lymphs Abs: 3.5
Monocytes Absolute: 0.7
Monocytes Relative: 7
Neutrophils Relative %: 57

## 2011-09-10 LAB — B-NATRIURETIC PEPTIDE (CONVERTED LAB): Pro B Natriuretic peptide (BNP): <30

## 2011-09-10 LAB — CBC
Hemoglobin: 13.6
MCHC: 35.4
RDW: 13.5

## 2011-09-29 DIAGNOSIS — R3911 Hesitancy of micturition: Secondary | ICD-10-CM | POA: Insufficient documentation

## 2011-09-29 DIAGNOSIS — R350 Frequency of micturition: Secondary | ICD-10-CM | POA: Insufficient documentation

## 2011-09-29 DIAGNOSIS — R109 Unspecified abdominal pain: Secondary | ICD-10-CM | POA: Insufficient documentation

## 2012-02-20 DIAGNOSIS — N760 Acute vaginitis: Secondary | ICD-10-CM | POA: Insufficient documentation

## 2012-04-11 ENCOUNTER — Encounter (HOSPITAL_BASED_OUTPATIENT_CLINIC_OR_DEPARTMENT_OTHER): Payer: Self-pay | Admitting: *Deleted

## 2012-04-13 NOTE — H&P (Signed)
Kelly Garcia is an 46 y.o. female.   Chief Complaint: Right hand numbness HPI: c/o of increasing right hand numbness over past few months. It has gotten much worse in last few weeks. She is now not resting at night because of it despite using a brace. Recent EMG reveals cts right hand. We have discussed moving forward with a carpal tunnel release.  Past Medical History  Diagnosis Date  . Multiple allergies   . Interstitial cystitis   . Asthma   . Anxiety     Past Surgical History  Procedure Date  . Abdominal hysterectomy   . Cystoscopy with hydrodistension and biopsy   . Cystoscopy w/ dilation of bladder     multiple times for IC  . Cystoscopy w/ dilation of bladder     bladder instillation of medications  . Cesarean section     No family history on file. Social History:  does not have a smoking history on file. She does not have any smokeless tobacco history on file. Her alcohol and drug histories not on file.  Allergies:  Allergies  Allergen Reactions  . Doxycycline Itching  . Morphine And Related Itching    No prescriptions prior to admission    No results found for this or any previous visit (from the past 48 hour(s)). No results found.  Review of Systems  Constitutional: Negative.   HENT: Negative.   Eyes: Negative.   Respiratory: Negative.   Cardiovascular: Negative.   Gastrointestinal: Negative.   Genitourinary: Negative.   Musculoskeletal: Negative.   Skin: Negative.   Neurological: Negative.   Endo/Heme/Allergies: Negative.   Psychiatric/Behavioral: Negative.     There were no vitals taken for this visit. Physical Exam  Constitutional: She is oriented to person, place, and time. She appears well-developed.  HENT:  Head: Normocephalic.  Eyes: Pupils are equal, round, and reactive to light.  Neck: Normal range of motion.  Cardiovascular: Regular rhythm.   Respiratory: Breath sounds normal.  GI: Bowel sounds are normal.  Musculoskeletal:   Right hand : positive tinel's . Decreased sensation to light touch. Normal rom and vascular status. + Phalen's test  Neurological: She is oriented to person, place, and time.  Skin: Skin is warm.  Psychiatric: She has a normal mood and affect.     Assessment/Plan A; Right hand carpal tunnel syndrome. P: Proceed with right carpal tunnel release to decrease pain and improve function. Risks are discussed with patient.  Judah Chevere R 04/13/2012, 10:15 AM

## 2012-04-14 ENCOUNTER — Encounter (HOSPITAL_BASED_OUTPATIENT_CLINIC_OR_DEPARTMENT_OTHER): Payer: Self-pay | Admitting: *Deleted

## 2012-04-14 NOTE — Progress Notes (Signed)
No labs needed

## 2012-04-15 ENCOUNTER — Encounter (HOSPITAL_BASED_OUTPATIENT_CLINIC_OR_DEPARTMENT_OTHER): Payer: Self-pay | Admitting: *Deleted

## 2012-04-15 ENCOUNTER — Ambulatory Visit (HOSPITAL_BASED_OUTPATIENT_CLINIC_OR_DEPARTMENT_OTHER)
Admission: RE | Admit: 2012-04-15 | Discharge: 2012-04-15 | Disposition: A | Discharge status: Home / Self Care | Payer: BC Managed Care – PPO | Source: Ambulatory Visit | Attending: Orthopaedic Surgery | Admitting: Orthopaedic Surgery

## 2012-04-15 ENCOUNTER — Ambulatory Visit (HOSPITAL_BASED_OUTPATIENT_CLINIC_OR_DEPARTMENT_OTHER): Payer: BC Managed Care – PPO | Admitting: *Deleted

## 2012-04-15 ENCOUNTER — Encounter (HOSPITAL_BASED_OUTPATIENT_CLINIC_OR_DEPARTMENT_OTHER)
Admission: RE | Disposition: A | Discharge status: Home / Self Care | Payer: Self-pay | Source: Ambulatory Visit | Attending: Orthopaedic Surgery

## 2012-04-15 DIAGNOSIS — F411 Generalized anxiety disorder: Secondary | ICD-10-CM | POA: Insufficient documentation

## 2012-04-15 DIAGNOSIS — G5600 Carpal tunnel syndrome, unspecified upper limb: Secondary | ICD-10-CM | POA: Insufficient documentation

## 2012-04-15 DIAGNOSIS — G5601 Carpal tunnel syndrome, right upper limb: Secondary | ICD-10-CM

## 2012-04-15 DIAGNOSIS — N3010 Interstitial cystitis (chronic) without hematuria: Secondary | ICD-10-CM | POA: Insufficient documentation

## 2012-04-15 HISTORY — DX: Other specified postprocedural states: Z98.890

## 2012-04-15 HISTORY — PX: CARPAL TUNNEL RELEASE: SHX101

## 2012-04-15 HISTORY — DX: Anxiety disorder, unspecified: F41.9

## 2012-04-15 HISTORY — DX: Unspecified osteoarthritis, unspecified site: M19.90

## 2012-04-15 HISTORY — DX: Interstitial cystitis (chronic) without hematuria: N30.10

## 2012-04-15 HISTORY — DX: Other chronic pain: G89.29

## 2012-04-15 HISTORY — DX: Carpal tunnel syndrome, unspecified upper limb: G56.00

## 2012-04-15 HISTORY — DX: Other complications of anesthesia, initial encounter: T88.59XA

## 2012-04-15 HISTORY — DX: Pelvic and perineal pain: R10.2

## 2012-04-15 HISTORY — DX: Allergy status to unspecified drugs, medicaments and biological substances: Z88.9

## 2012-04-15 HISTORY — DX: Other specified postprocedural states: R11.2

## 2012-04-15 HISTORY — DX: Adverse effect of unspecified anesthetic, initial encounter: T41.45XA

## 2012-04-15 LAB — POCT HEMOGLOBIN-HEMACUE: Hemoglobin: 13.4 g/dL (ref 12.0–15.0)

## 2012-04-15 SURGERY — CARPAL TUNNEL RELEASE
Anesthesia: General | Site: Wrist | Laterality: Right | Wound class: Clean

## 2012-04-15 MED ORDER — MIDAZOLAM HCL 5 MG/5ML IJ SOLN
INTRAMUSCULAR | Status: DC | PRN
  Administered 2012-04-15: 2 mg via INTRAVENOUS

## 2012-04-15 MED ORDER — LACTATED RINGERS IV SOLN
INTRAVENOUS | Status: DC
  Administered 2012-04-15: 12:00:00 via INTRAVENOUS

## 2012-04-15 MED ORDER — TRAMADOL HCL 50 MG PO TABS
50.0000 mg | ORAL_TABLET | Freq: Once | ORAL | Status: AC
  Administered 2012-04-15: 50 mg via ORAL

## 2012-04-15 MED ORDER — PROPOFOL 10 MG/ML IV EMUL
INTRAVENOUS | Status: DC | PRN
  Administered 2012-04-15: 200 mg via INTRAVENOUS

## 2012-04-15 MED ORDER — ONDANSETRON HCL 4 MG/2ML IJ SOLN
INTRAMUSCULAR | Status: DC | PRN
  Administered 2012-04-15: 4 mg via INTRAVENOUS

## 2012-04-15 MED ORDER — FENTANYL CITRATE 0.05 MG/ML IJ SOLN
INTRAMUSCULAR | Status: DC | PRN
  Administered 2012-04-15: 100 ug via INTRAVENOUS

## 2012-04-15 MED ORDER — DIPHENHYDRAMINE HCL 50 MG/ML IJ SOLN: 25.0000 mg | Freq: Once | INTRAMUSCULAR | Status: DC

## 2012-04-15 MED ORDER — DEXAMETHASONE SODIUM PHOSPHATE 10 MG/ML IJ SOLN
INTRAMUSCULAR | Status: DC | PRN
  Administered 2012-04-15: 10 mg via INTRAVENOUS

## 2012-04-15 MED ORDER — DROPERIDOL 2.5 MG/ML IJ SOLN: 0.6250 mg | INTRAMUSCULAR | Status: DC | PRN

## 2012-04-15 MED ORDER — HYDROMORPHONE HCL PF 1 MG/ML IJ SOLN
0.2500 mg | INTRAMUSCULAR | Status: DC | PRN
  Administered 2012-04-15: 0.5 mg via INTRAVENOUS

## 2012-04-15 MED ORDER — LIDOCAINE HCL (CARDIAC) 20 MG/ML IV SOLN
INTRAVENOUS | Status: DC | PRN
  Administered 2012-04-15: 60 mg via INTRAVENOUS

## 2012-04-15 MED ORDER — CEFAZOLIN SODIUM 1-5 GM-% IV SOLN
INTRAVENOUS | Status: DC | PRN
  Administered 2012-04-15: 1 g via INTRAVENOUS

## 2012-04-15 MED ORDER — TRAMADOL HCL 50 MG PO TABS: 50.0000 mg | ORAL_TABLET | Freq: Four times a day (QID) | ORAL | Status: AC | PRN

## 2012-04-15 MED ORDER — SCOPOLAMINE 1 MG/3DAYS TD PT72
1.0000 | MEDICATED_PATCH | Freq: Once | TRANSDERMAL | Status: DC
  Administered 2012-04-15: 1.5 mg via TRANSDERMAL

## 2012-04-15 MED ORDER — HYDROMORPHONE HCL PF 1 MG/ML IJ SOLN
0.2500 mg | INTRAMUSCULAR | Status: DC | PRN
  Administered 2012-04-15 (×4): 0.5 mg via INTRAVENOUS

## 2012-04-15 SURGICAL SUPPLY — 47 items
BANDAGE ELASTIC 4 VELCRO ST LF (GAUZE/BANDAGES/DRESSINGS) ×2 IMPLANT
BANDAGE GAUZE ELAST BULKY 4 IN (GAUZE/BANDAGES/DRESSINGS) ×2 IMPLANT
BLADE MINI RND TIP GREEN BEAV (BLADE) ×2 IMPLANT
BLADE SURG 15 STRL LF DISP TIS (BLADE) ×1 IMPLANT
BLADE SURG 15 STRL SS (BLADE) ×1
BNDG ESMARK 4X9 LF (GAUZE/BANDAGES/DRESSINGS) IMPLANT
COVER MAYO STAND STRL (DRAPES) ×2 IMPLANT
COVER TABLE BACK 60X90 (DRAPES) ×2 IMPLANT
CUFF TOURNIQUET SINGLE 18IN (TOURNIQUET CUFF) ×2 IMPLANT
DRAPE EXTREMITY T 121X128X90 (DRAPE) ×2 IMPLANT
DRAPE SURG 17X23 STRL (DRAPES) ×2 IMPLANT
DRSG EMULSION OIL 3X3 NADH (GAUZE/BANDAGES/DRESSINGS) ×2 IMPLANT
DRSG PAD ABDOMINAL 8X10 ST (GAUZE/BANDAGES/DRESSINGS) IMPLANT
DURAPREP 26ML APPLICATOR (WOUND CARE) ×2 IMPLANT
ELECT NEEDLE TIP 2.8 STRL (NEEDLE) IMPLANT
ELECT REM PT RETURN 9FT ADLT (ELECTROSURGICAL) ×2
ELECTRODE REM PT RTRN 9FT ADLT (ELECTROSURGICAL) ×1 IMPLANT
GLOVE BIO SURGEON STRL SZ8.5 (GLOVE) ×2 IMPLANT
GLOVE BIOGEL PI IND STRL 8 (GLOVE) ×3 IMPLANT
GLOVE BIOGEL PI IND STRL 8.5 (GLOVE) ×1 IMPLANT
GLOVE BIOGEL PI INDICATOR 8 (GLOVE) ×3
GLOVE BIOGEL PI INDICATOR 8.5 (GLOVE) ×1
GLOVE SS BIOGEL STRL SZ 8 (GLOVE) ×3 IMPLANT
GLOVE SUPERSENSE BIOGEL SZ 8 (GLOVE) ×3
GOWN PREVENTION PLUS XLARGE (GOWN DISPOSABLE) ×6 IMPLANT
LOOP VESSEL MAXI BLUE (MISCELLANEOUS) IMPLANT
NEEDLE HYPO 25X1 1.5 SAFETY (NEEDLE) IMPLANT
NS IRRIG 1000ML POUR BTL (IV SOLUTION) ×2 IMPLANT
PACK BASIN DAY SURGERY FS (CUSTOM PROCEDURE TRAY) ×2 IMPLANT
PADDING CAST ABS 3INX4YD NS (CAST SUPPLIES) ×1
PADDING CAST ABS 4INX4YD NS (CAST SUPPLIES)
PADDING CAST ABS COTTON 3X4 (CAST SUPPLIES) ×1 IMPLANT
PADDING CAST ABS COTTON 4X4 ST (CAST SUPPLIES) IMPLANT
PENCIL BUTTON HOLSTER BLD 10FT (ELECTRODE) IMPLANT
SPLINT PLASTER CAST XFAST 3X15 (CAST SUPPLIES) ×5 IMPLANT
SPLINT PLASTER XTRA FASTSET 3X (CAST SUPPLIES) ×5
SPONGE GAUZE 4X4 12PLY (GAUZE/BANDAGES/DRESSINGS) ×2 IMPLANT
STOCKINETTE 4X48 STRL (DRAPES) ×2 IMPLANT
SUT ETHILON 4 0 PS 2 18 (SUTURE) ×2 IMPLANT
SUT VIC AB 4-0 P-3 18XBRD (SUTURE) IMPLANT
SUT VIC AB 4-0 P3 18 (SUTURE)
SYR BULB 3OZ (MISCELLANEOUS) IMPLANT
SYR CONTROL 10ML LL (SYRINGE) IMPLANT
TOWEL OR 17X24 6PK STRL BLUE (TOWEL DISPOSABLE) ×2 IMPLANT
TOWEL OR NON WOVEN STRL DISP B (DISPOSABLE) ×2 IMPLANT
UNDERPAD 30X30 INCONTINENT (UNDERPADS AND DIAPERS) ×2 IMPLANT
WATER STERILE IRR 1000ML POUR (IV SOLUTION) ×2 IMPLANT

## 2012-04-15 NOTE — Transfer of Care (Signed)
Immediate Anesthesia Transfer of Care Note  Patient: Kelly Garcia  Procedure(s) Performed: Procedure(s) (LRB): CARPAL TUNNEL RELEASE (Right)  Patient Location: PACU  Anesthesia Type: General  Level of Consciousness: awake, oriented and patient cooperative  Airway & Oxygen Therapy: Patient Spontanous Breathing and Patient connected to face mask oxygen  Post-op Assessment: Report given to PACU RN, Post -op Vital signs reviewed and stable and Patient moving all extremities  Post vital signs: Reviewed and stable  Complications: No apparent anesthesia complications

## 2012-04-15 NOTE — Anesthesia Procedure Notes (Signed)
Procedure Name: LMA Insertion Date/Time: 04/15/2012 1:21 PM Performed by: Meyer Russel Pre-anesthesia Checklist: Patient identified, Emergency Drugs available, Suction available and Patient being monitored Patient Re-evaluated:Patient Re-evaluated prior to inductionOxygen Delivery Method: Circle System Utilized Preoxygenation: Pre-oxygenation with 100% oxygen Intubation Type: IV induction Ventilation: Mask ventilation without difficulty LMA: LMA inserted LMA Size: 3.0 Number of attempts: 1 Airway Equipment and Method: bite block Placement Confirmation: positive ETCO2 and breath sounds checked- equal and bilateral Tube secured with: Tape Dental Injury: Teeth and Oropharynx as per pre-operative assessment

## 2012-04-15 NOTE — Op Note (Signed)
PRE-OP DIAGNOSIS:  right carpal tunnel syndrome POST-OP DIAGNOSIS:  right carpal tunnel syndrome PROCEDURE:  right carpal tunnel release ANESTHESIA:  general ATTENDING SURGEON:  Marcene Corning MD ASSISTANT:  Lindwood Qua PA  INDICATION FOR PROCEDURE:  Kelly Garcia is a 46 y.o. female with a long history of hand pain and numbness.  The patient has failed conservative measures of treatment and persists with symptoms which are very limiting.  The patient is offered carpal tunnel release in hopes of improving their situation by relieving pressure on the medial nerve.  Informed operative consent was obtained after discussion of possible complications including reaction to anesthesia, infection, and neurovascular injury.  SUMMARY OF FINDINGS AND PROCEDURE:  Under to above anesthetic a carpal tunnel release was performed.  The patient had a very thickened transverse carpal tunnel ligament which was applying compression to the underlying medial nerve.  This was release under direct visualization.  The skin was closed primarily followed by placement of a sterile dressing and splint with the wrist in slight extension.  DESCRIPTION OF PROCEDURE:  Kelly Garcia was taken to an operative suite where the above anesthetic was applied.  The patient was then prepped and draped in normal sterile fashion.  An appropriate time out was performed.  After the administration of kefzol pre-operative antibiotic and application and inflation of a tourniquet a small incision was made on the palmar aspect of the palm.  This did not cross the wrist flexion crease and was ulnar to the thenar flexion crease.  Dissection was carried down through palmar fascia to expose the transverse carpal ligament which was released under direct visualization.  The dissection was taken distally towards the transverse arch of vessels and distally through the distal fascia of the forearm.  The wound was irrigated and the skin was closed with  nylon.  The tourniquet was deflated and skin edges became pink immediately and pulses were intact.  Adaptic was applied along with a sterile dressing and a plaster splint with the wrist in slight extension.  Estimated blood loss and intraoperative fluids can be obtained from anesthesia records as can tourniquet time.  DISPOSITION:  Kelly Garcia was taken to recovery room in stable condition.  Plans were to go home same day and I will contact the patient by phone tonight.  The patient will follow-up in about a week in the office.  Marveline Profeta G 04/15/2012, 1:44 PM

## 2012-04-15 NOTE — Discharge Instructions (Signed)
Right wrist - ice and elevation. Leave dressing on till office visit in 10- 12 days Ultram for pain    Post Anesthesia Home Care Instructions  Activity: Get plenty of rest for the remainder of the day. A responsible adult should stay with you for 24 hours following the procedure.  For the next 24 hours, DO NOT: -Drive a car -Advertising copywriter -Drink alcoholic beverages -Take any medication unless instructed by your physician -Make any legal decisions or sign important papers.  Meals: Start with liquid foods such as gelatin or soup. Progress to regular foods as tolerated. Avoid greasy, spicy, heavy foods. If nausea and/or vomiting occur, drink only clear liquids until the nausea and/or vomiting subsides. Call your physician if vomiting continues.  Special Instructions/Symptoms: Your throat may feel dry or sore from the anesthesia or the breathing tube placed in your throat during surgery. If this causes discomfort, gargle with warm salt water. The discomfort should disappear within 24 hours.

## 2012-04-15 NOTE — Anesthesia Preprocedure Evaluation (Addendum)
Anesthesia Evaluation  Patient identified by MRN, date of birth, ID band Patient awake    Reviewed: Allergy & Precautions, H&P , NPO status , Patient's Chart, lab work & pertinent test results  History of Anesthesia Complications (+) PONV  Airway Mallampati: II TM Distance: >3 FB Neck ROM: Full    Dental  (+) Teeth Intact and Dental Advisory Given   Pulmonary  breath sounds clear to auscultation  Pulmonary exam normal       Cardiovascular negative cardio ROS  Rhythm:Regular Rate:Normal     Neuro/Psych Anxiety negative neurological ROS     GI/Hepatic negative GI ROS, Neg liver ROS,   Endo/Other  negative endocrine ROS  Renal/GU negative Renal ROS     Musculoskeletal   Abdominal   Peds  Hematology   Anesthesia Other Findings   Reproductive/Obstetrics                           Anesthesia Physical Anesthesia Plan  ASA: II  Anesthesia Plan: General   Post-op Pain Management:    Induction: Intravenous  Airway Management Planned: LMA  Additional Equipment:   Intra-op Plan:   Post-operative Plan: Extubation in OR  Informed Consent: I have reviewed the patients History and Physical, chart, labs and discussed the procedure including the risks, benefits and alternatives for the proposed anesthesia with the patient or authorized representative who has indicated his/her understanding and acceptance.   Dental advisory given  Plan Discussed with: CRNA, Anesthesiologist and Surgeon  Anesthesia Plan Comments:        Anesthesia Quick Evaluation

## 2012-04-15 NOTE — Interval H&P Note (Signed)
History and Physical Interval Note:  04/15/2012 11:41 AM  Kelly Garcia  has presented today for surgery, with the diagnosis of right carpal tunnel syndrome  The various methods of treatment have been discussed with the patient and family. After consideration of risks, benefits and other options for treatment, the patient has consented to  Procedure(s) (LRB): CARPAL TUNNEL RELEASE (Right) as a surgical intervention .  The patients' history has been reviewed, patient examined, no change in status, stable for surgery.  I have reviewed the patients' chart and labs.  Questions were answered to the patient's satisfaction.     Ezio Wieck G

## 2012-04-15 NOTE — Anesthesia Postprocedure Evaluation (Signed)
Anesthesia Post Note  Patient: Kelly Garcia  Procedure(s) Performed: Procedure(s) (LRB): CARPAL TUNNEL RELEASE (Right)  Anesthesia type: general  Patient location: PACU  Post pain: Pain level controlled  Post assessment: Patient's Cardiovascular Status Stable  Last Vitals:  Filed Vitals:   04/15/12 1545  BP: 111/63  Pulse: 50  Temp: 36.4 C  Resp: 16    Post vital signs: Reviewed and stable  Level of consciousness: sedated  Complications: No apparent anesthesia complications

## 2012-04-16 ENCOUNTER — Encounter (HOSPITAL_BASED_OUTPATIENT_CLINIC_OR_DEPARTMENT_OTHER): Payer: Self-pay | Admitting: Orthopaedic Surgery

## 2012-07-01 ENCOUNTER — Other Ambulatory Visit: Payer: Self-pay | Admitting: Obstetrics and Gynecology

## 2012-07-01 DIAGNOSIS — Z9889 Other specified postprocedural states: Secondary | ICD-10-CM

## 2012-07-01 DIAGNOSIS — Z1231 Encounter for screening mammogram for malignant neoplasm of breast: Secondary | ICD-10-CM

## 2012-07-29 ENCOUNTER — Ambulatory Visit: Payer: BC Managed Care – PPO

## 2012-08-14 ENCOUNTER — Ambulatory Visit: Payer: BC Managed Care – PPO

## 2012-08-28 ENCOUNTER — Ambulatory Visit: Payer: BC Managed Care – PPO

## 2012-09-22 ENCOUNTER — Ambulatory Visit: Payer: BC Managed Care – PPO

## 2012-10-17 ENCOUNTER — Other Ambulatory Visit: Payer: Self-pay | Admitting: Orthopaedic Surgery

## 2012-10-23 NOTE — Progress Notes (Signed)
No labs needed

## 2012-10-24 NOTE — H&P (Signed)
Kelly Garcia is an 46 y.o. female.   Chief Complaint: right trigger thumb HPI: Kelly Garcia is complaining of continued triggering in her right thumb despite multiple injections.  It is keeping her from being able to perform activities normally without pain or triggering.  It even catches her triggers at nighttime .  X-rays of her hand are essentially normal.   We have discussed proceeding with a right trigger thumb release to help with this problem.  Symptoms have been present for multiple months.  Past Medical History  Diagnosis Date  . Multiple allergies   . Interstitial cystitis   . Complication of anesthesia     n/v-wants scope patch  . PONV (postoperative nausea and vomiting)   . Asthma     controlled  . Anxiety   . Chronic pelvic pain in female   . Carpal tunnel syndrome   . Arthritis     Past Surgical History  Procedure Date  . Abdominal hysterectomy   . Cystoscopy with hydrodistension and biopsy   . Cystoscopy w/ dilation of bladder     multiple times for IC  . Cystoscopy w/ dilation of bladder     bladder instillation of medications  . Cesarean section   . Breast reduction surgery   . Knee arthroscopy     rightx6  . Knee arthroscopy     leftx2  . Ankle arthroscopy     right  . Diagnostic laparoscopy     exp lap-lysis adhesions  . Carpal tunnel release 04/15/2012    Procedure: CARPAL TUNNEL RELEASE;  Surgeon: Velna Ochs, MD;  Location: [facility];  Service: Orthopedics;  Laterality: Right;    No family history on file. Social History:  does not have a smoking history on file. She does not have any smokeless tobacco history on file. She reports that she drinks alcohol. Her drug history not on file.  Allergies:  Allergies  Allergen Reactions  . Benzoin     blister  . Codeine Nausea And Vomiting  . Doxycycline Itching  . Morphine And Related Itching  . Sulfa Antibiotics Hives    No prescriptions prior to admission    No results found for  this or any previous visit (from the past 48 hour(s)). No results found.  Review of Systems  Constitutional: Negative.   HENT: Negative.   Eyes: Negative.   Respiratory: Negative.   Cardiovascular: Negative.   Gastrointestinal: Negative.   Genitourinary: Negative.   Musculoskeletal: Negative.   Skin: Negative.   Neurological: Negative.   Endo/Heme/Allergies: Negative.   Psychiatric/Behavioral: Negative.     Height 5\' 2"  (1.575 m), weight 56.7 kg (125 lb). Physical Exam  Constitutional: She is oriented to person, place, and time. She appears well-nourished.  HENT:  Head: Atraumatic.  Eyes: EOM are normal.  Neck: Neck supple.  Cardiovascular: Regular rhythm.   Respiratory: Breath sounds normal.  GI: Soft.  Musculoskeletal:       Right thumb exam right thumb does actively trigger.  Normal neurovascular status.  Passes 2 point she relation to her fingertips.  Neurological: She is oriented to person, place, and time.  Skin: Skin is warm.  Psychiatric: She has a normal mood and affect.     Assessment/Plan Assessment: Right trigger thumb Plan.  Kelly Garcia has a right thumb continues to trigger despite multiple injections.  It has been bothering her for a number of months and we have discussed releasing it surgically.  We have discussed the risk of anesthesia,  infection and neurovascular injury associated with a trigger thumb release.  Have also discussed the potential need for physical therapy postoperatively.  Kelly Garcia R 10/24/2012, 12:23 PM

## 2012-10-27 ENCOUNTER — Ambulatory Visit
Admission: RE | Admit: 2012-10-27 | Discharge: 2012-10-27 | Disposition: A | Discharge status: Home / Self Care | Payer: BC Managed Care – PPO | Source: Ambulatory Visit | Attending: Obstetrics and Gynecology | Admitting: Obstetrics and Gynecology

## 2012-10-27 DIAGNOSIS — Z1231 Encounter for screening mammogram for malignant neoplasm of breast: Secondary | ICD-10-CM

## 2012-10-27 DIAGNOSIS — Z9889 Other specified postprocedural states: Secondary | ICD-10-CM

## 2012-10-28 ENCOUNTER — Encounter (HOSPITAL_BASED_OUTPATIENT_CLINIC_OR_DEPARTMENT_OTHER)
Admission: RE | Disposition: A | Discharge status: Home / Self Care | Payer: Self-pay | Source: Ambulatory Visit | Attending: Orthopaedic Surgery

## 2012-10-28 ENCOUNTER — Encounter (HOSPITAL_BASED_OUTPATIENT_CLINIC_OR_DEPARTMENT_OTHER): Payer: Self-pay | Admitting: *Deleted

## 2012-10-28 ENCOUNTER — Encounter (HOSPITAL_BASED_OUTPATIENT_CLINIC_OR_DEPARTMENT_OTHER): Payer: Self-pay

## 2012-10-28 ENCOUNTER — Ambulatory Visit (HOSPITAL_BASED_OUTPATIENT_CLINIC_OR_DEPARTMENT_OTHER)
Admission: RE | Admit: 2012-10-28 | Discharge: 2012-10-28 | Disposition: A | Discharge status: Home / Self Care | Payer: BC Managed Care – PPO | Source: Ambulatory Visit | Attending: Orthopaedic Surgery | Admitting: Orthopaedic Surgery

## 2012-10-28 DIAGNOSIS — M6530 Trigger finger, unspecified finger: Secondary | ICD-10-CM | POA: Insufficient documentation

## 2012-10-28 DIAGNOSIS — M129 Arthropathy, unspecified: Secondary | ICD-10-CM | POA: Insufficient documentation

## 2012-10-28 DIAGNOSIS — N949 Unspecified condition associated with female genital organs and menstrual cycle: Secondary | ICD-10-CM | POA: Insufficient documentation

## 2012-10-28 DIAGNOSIS — J45909 Unspecified asthma, uncomplicated: Secondary | ICD-10-CM | POA: Insufficient documentation

## 2012-10-28 DIAGNOSIS — F411 Generalized anxiety disorder: Secondary | ICD-10-CM | POA: Insufficient documentation

## 2012-10-28 HISTORY — PX: TRIGGER FINGER RELEASE: SHX641

## 2012-10-28 SURGERY — RELEASE, A1 PULLEY, FOR TRIGGER FINGER
Anesthesia: General | Site: Thumb | Laterality: Right | Wound class: Clean

## 2012-10-28 MED ORDER — SCOPOLAMINE 1 MG/3DAYS TD PT72: 1.0000 | MEDICATED_PATCH | TRANSDERMAL | Status: DC

## 2012-10-28 SURGICAL SUPPLY — 44 items
BANDAGE ELASTIC 4 VELCRO ST LF (GAUZE/BANDAGES/DRESSINGS) ×2 IMPLANT
BANDAGE GAUZE ELAST BULKY 4 IN (GAUZE/BANDAGES/DRESSINGS) ×2 IMPLANT
BLADE MINI RND TIP GREEN BEAV (BLADE) ×2 IMPLANT
BLADE SURG 15 STRL LF DISP TIS (BLADE) ×1 IMPLANT
BLADE SURG 15 STRL SS (BLADE) ×1
BNDG ESMARK 4X9 LF (GAUZE/BANDAGES/DRESSINGS) ×2 IMPLANT
COVER MAYO STAND STRL (DRAPES) ×2 IMPLANT
COVER TABLE BACK 60X90 (DRAPES) ×2 IMPLANT
CUFF TOURNIQUET SINGLE 18IN (TOURNIQUET CUFF) ×2 IMPLANT
DRAPE EXTREMITY T 121X128X90 (DRAPE) ×2 IMPLANT
DRAPE U-SHAPE 47X51 STRL (DRAPES) IMPLANT
DRSG EMULSION OIL 3X3 NADH (GAUZE/BANDAGES/DRESSINGS) ×2 IMPLANT
DURAPREP 26ML APPLICATOR (WOUND CARE) ×2 IMPLANT
ELECT NEEDLE TIP 2.8 STRL (NEEDLE) IMPLANT
ELECT REM PT RETURN 9FT ADLT (ELECTROSURGICAL) ×2
ELECTRODE REM PT RTRN 9FT ADLT (ELECTROSURGICAL) ×1 IMPLANT
GLOVE BIO SURGEON STRL SZ 6.5 (GLOVE) ×2 IMPLANT
GLOVE BIO SURGEON STRL SZ8.5 (GLOVE) ×2 IMPLANT
GLOVE BIOGEL PI IND STRL 7.0 (GLOVE) ×1 IMPLANT
GLOVE BIOGEL PI IND STRL 8 (GLOVE) ×1 IMPLANT
GLOVE BIOGEL PI IND STRL 8.5 (GLOVE) ×1 IMPLANT
GLOVE BIOGEL PI INDICATOR 7.0 (GLOVE) ×1
GLOVE BIOGEL PI INDICATOR 8 (GLOVE) ×1
GLOVE BIOGEL PI INDICATOR 8.5 (GLOVE) ×1
GLOVE SS BIOGEL STRL SZ 8 (GLOVE) ×1 IMPLANT
GLOVE SUPERSENSE BIOGEL SZ 8 (GLOVE) ×1
GOWN PREVENTION PLUS XLARGE (GOWN DISPOSABLE) ×2 IMPLANT
GOWN STRL REIN XL XLG (GOWN DISPOSABLE) ×4 IMPLANT
LOOP VESSEL MAXI BLUE (MISCELLANEOUS) IMPLANT
NEEDLE HYPO 25X1 1.5 SAFETY (NEEDLE) ×2 IMPLANT
NS IRRIG 1000ML POUR BTL (IV SOLUTION) ×2 IMPLANT
PACK BASIN DAY SURGERY FS (CUSTOM PROCEDURE TRAY) ×2 IMPLANT
PENCIL BUTTON HOLSTER BLD 10FT (ELECTRODE) ×2 IMPLANT
SPONGE GAUZE 4X4 12PLY (GAUZE/BANDAGES/DRESSINGS) ×2 IMPLANT
STOCKINETTE 4X48 STRL (DRAPES) ×2 IMPLANT
SUT ETHILON 4 0 PS 2 18 (SUTURE) ×2 IMPLANT
SUT VIC AB 4-0 P-3 18XBRD (SUTURE) IMPLANT
SUT VIC AB 4-0 P3 18 (SUTURE)
SYR BULB 3OZ (MISCELLANEOUS) ×2 IMPLANT
SYR CONTROL 10ML LL (SYRINGE) IMPLANT
TOWEL OR 17X24 6PK STRL BLUE (TOWEL DISPOSABLE) ×2 IMPLANT
TOWEL OR NON WOVEN STRL DISP B (DISPOSABLE) ×2 IMPLANT
UNDERPAD 30X30 INCONTINENT (UNDERPADS AND DIAPERS) ×2 IMPLANT
WATER STERILE IRR 1000ML POUR (IV SOLUTION) ×2 IMPLANT

## 2012-10-28 NOTE — Interval H&P Note (Signed)
History and Physical Interval Note:  10/28/2012 9:09 AM  Kelly Garcia  has presented today for surgery, with the diagnosis of Right Trigger Thumb  The various methods of treatment have been discussed with the patient and family. After consideration of risks, benefits and other options for treatment, the patient has consented to  Procedure(s) (LRB) with comments: RELEASE TRIGGER FINGER/A-1 PULLEY (Right) as a surgical intervention .  The patient's history has been reviewed, patient examined, no change in status, stable for surgery.  I have reviewed the patient's chart and labs.  Questions were answered to the patient's satisfaction.     [name] G

## 2012-10-28 NOTE — Anesthesia Preprocedure Evaluation (Deleted)
Anesthesia Evaluation  Patient identified by MRN, date of birth, ID band Patient awake    Reviewed: Allergy & Precautions, H&P , NPO status , Patient's Chart, lab work & pertinent test results  History of Anesthesia Complications (+) PONV  Airway Mallampati: II TM Distance: >3 FB Neck ROM: Full    Dental No notable dental hx. (+) Teeth Intact and Dental Advisory Given   Pulmonary asthma ,  breath sounds clear to auscultation  Pulmonary exam normal       Cardiovascular negative cardio ROS  Rhythm:Regular Rate:Normal     Neuro/Psych negative neurological ROS  negative psych ROS   GI/Hepatic negative GI ROS, Neg liver ROS,   Endo/Other  negative endocrine ROS  Renal/GU negative Renal ROS  negative genitourinary   Musculoskeletal   Abdominal   Peds  Hematology negative hematology ROS (+)   Anesthesia Other Findings   Reproductive/Obstetrics negative OB ROS                           Anesthesia Physical Anesthesia Plan  ASA: II  Anesthesia Plan: General   Post-op Pain Management:    Induction: Intravenous  Airway Management Planned: LMA  Additional Equipment:   Intra-op Plan:   Post-operative Plan: Extubation in OR  Informed Consent: I have reviewed the patients History and Physical, chart, labs and discussed the procedure including the risks, benefits and alternatives for the proposed anesthesia with the patient or authorized representative who has indicated his/her understanding and acceptance.   Dental advisory given  Plan Discussed with: CRNA  Anesthesia Plan Comments:         Anesthesia Quick Evaluation

## 2012-10-29 MED ORDER — BUPIVACAINE HCL (PF) 0.25 % IJ SOLN
INTRAMUSCULAR | Status: DC | PRN
  Administered 2012-10-28: 2 mL

## 2012-10-29 NOTE — Op Note (Signed)
Kelly Garcia, Kelly Garcia                ACCOUNT NO.:  [id number]  MEDICAL RECORD NO.:  [id number]  LOCATION:                                 FACILITY:  PHYSICIAN:  Lubertha Basque. Meleane Selinger, M.D.DATE OF BIRTH:  03/23/1966  DATE OF PROCEDURE:  10/28/2012 DATE OF DISCHARGE:                              OPERATIVE REPORT   PREOPERATIVE DIAGNOSIS:  Right trigger thumb.  POSTOPERATIVE DIAGNOSIS:  Right trigger thumb.  PROCEDURE:  Right trigger thumb release.  ANESTHESIA:  General.  ATTENDING SURGEON:  Lubertha Basque. Jerl Santos, M.D.  ASSISTANT:  Lindwood Qua, PA  INDICATION FOR PROCEDURE:  The patient is a 46 year old woman with a long history of a triggering right thumb.  This was persisted despite bracing and pills, and two injections, both of which afforded her transient release.  She was offered a release at this point.  Informed operative consent was obtained after discussion of possible complications including reaction to anesthesia, infection, and neurovascular injury.  SUMMARY OF FINDINGS AND PROCEDURE:  Under general anesthesia through a small transverse incision, an A1 pulley release was performed at the base of right thumb, this was very thick.  She was closed primarily and discharged to home.  DESCRIPTION OF PROCEDURE:  The patient was taken to the operating suite where general anesthetic was applied without difficulty.  She was positioned supine and prepped and draped in normal sterile fashion. After administration of IV Kefzol and an appropriate time-out, a small transverse incision was made at the first MCP flexion crease. Dissection was carried down through the flexor tendon sheath, but care was taken to retract the neurovascular structures out of harm's way. The sheath was then incised sharply.  The A1 pulley was completely released.  The tendon was pulled out of the wounds and seemed to be very free.  The wound was irrigated followed by reapproximation of skin with nylon in  vertical mattress fashion.  The tourniquet was deflated and the thumb became pink and warm immediately.  We kept some pressure on the wound for a while.  Adaptic was applied followed by dry gauze and an Ace wrap.  Estimated blood loss and intraoperative fluids were obtained from anesthesia records as can accurate tourniquet time.  DISPOSITION:  The patient was extubated in the operating room and taken to the recovery room in stable condition.  She was to go home same-day and follow up in the office next week.  I will contact her by phone tonight.     Lubertha Basque Jerl Santos, M.D.     PGD/MEDQ  D:  10/28/2012  T:  10/29/2012  Job:  [id number]

## 2012-10-30 ENCOUNTER — Encounter (HOSPITAL_BASED_OUTPATIENT_CLINIC_OR_DEPARTMENT_OTHER): Payer: Self-pay | Admitting: Orthopaedic Surgery

## 2013-02-04 DIAGNOSIS — F410 Panic disorder [episodic paroxysmal anxiety] without agoraphobia: Secondary | ICD-10-CM | POA: Insufficient documentation

## 2013-02-04 DIAGNOSIS — R5382 Chronic fatigue, unspecified: Secondary | ICD-10-CM | POA: Insufficient documentation

## 2013-11-03 DIAGNOSIS — M797 Fibromyalgia: Secondary | ICD-10-CM | POA: Insufficient documentation

## 2013-12-26 DIAGNOSIS — R319 Hematuria, unspecified: Secondary | ICD-10-CM | POA: Insufficient documentation

## 2014-04-22 ENCOUNTER — Other Ambulatory Visit: Payer: Self-pay

## 2014-04-22 DIAGNOSIS — Z1231 Encounter for screening mammogram for malignant neoplasm of breast: Secondary | ICD-10-CM

## 2014-05-12 ENCOUNTER — Ambulatory Visit
Admission: RE | Admit: 2014-05-12 | Discharge: 2014-05-12 | Disposition: A | Discharge status: Home / Self Care | Payer: BC Managed Care – PPO | Source: Ambulatory Visit

## 2014-05-12 DIAGNOSIS — Z1231 Encounter for screening mammogram for malignant neoplasm of breast: Secondary | ICD-10-CM

## 2014-05-13 ENCOUNTER — Other Ambulatory Visit: Payer: Self-pay | Admitting: Obstetrics and Gynecology

## 2014-05-13 DIAGNOSIS — R928 Other abnormal and inconclusive findings on diagnostic imaging of breast: Secondary | ICD-10-CM

## 2014-05-25 ENCOUNTER — Other Ambulatory Visit: Payer: BC Managed Care – PPO

## 2014-06-03 ENCOUNTER — Ambulatory Visit
Admission: RE | Admit: 2014-06-03 | Discharge: 2014-06-03 | Disposition: A | Discharge status: Home / Self Care | Payer: BC Managed Care – PPO | Source: Ambulatory Visit | Attending: Obstetrics and Gynecology | Admitting: Obstetrics and Gynecology

## 2014-06-03 DIAGNOSIS — R928 Other abnormal and inconclusive findings on diagnostic imaging of breast: Secondary | ICD-10-CM

## 2014-07-23 ENCOUNTER — Ambulatory Visit (INDEPENDENT_AMBULATORY_CARE_PROVIDER_SITE_OTHER): Payer: BC Managed Care – PPO

## 2014-07-23 VITALS — BP 135/92 | HR 108 | Resp 18

## 2014-07-23 DIAGNOSIS — M216X9 Other acquired deformities of unspecified foot: Secondary | ICD-10-CM

## 2014-07-23 DIAGNOSIS — M21071 Valgus deformity, not elsewhere classified, right ankle: Secondary | ICD-10-CM

## 2014-07-23 DIAGNOSIS — M76829 Posterior tibial tendinitis, unspecified leg: Secondary | ICD-10-CM

## 2014-07-23 DIAGNOSIS — M76821 Posterior tibial tendinitis, right leg: Secondary | ICD-10-CM

## 2014-07-23 DIAGNOSIS — M722 Plantar fascial fibromatosis: Secondary | ICD-10-CM

## 2014-07-23 NOTE — Patient Instructions (Signed)
ICE INSTRUCTIONS  Apply ice or cold pack to the affected area at least 3 times a day for 10-15 minutes each time.  You should also use ice after prolonged activity or vigorous exercise.  Do not apply ice longer than 20 minutes at one time.  Always keep a cloth between your skin and the ice pack to prevent burns.  Being consistent and following these instructions will help control your symptoms.  We suggest you purchase a gel ice pack because they are reusable and do bit leak.  Some of them are designed to wrap around the area.  Use the method that works best for you.  Here are some other suggestions for icing.   Use a frozen bag of peas or corn-inexpensive and molds well to your body, usually stays frozen for 10 to 20 minutes.  Wet a towel with cold water and squeeze out the excess until it's damp.  Place in a bag in the freezer for 20 minutes. Then remove and use.  Alternate application of a warm or hot compress for 10 minutes then ice pack for 10 minutes repeat hot and cold hot and cold several times the day

## 2014-07-23 NOTE — Progress Notes (Signed)
   Subjective:    Patient ID: Kelly Garcia, female    DOB: [birth year], 48 y.o.   MRN: [id number]  HPI I NEED SOME NEW INSERTS OR HAVE THESE THAT I HAVE WITH ME TODAY AND THEY HAVE BEEN GREAT AND I JUST NEED TO SEE IF I CAN GET THE RIGHT INSERT DONE DUE TO THE KNEE ROTATING ON THE RIGHT AND THE HIP HURTS AS WELL    Review of Systems  Eyes: Positive for itching.  Gastrointestinal: Positive for diarrhea.       Ibs  Genitourinary: Positive for urgency.  Musculoskeletal:       JOINT PAIN  Allergic/Immunologic: Positive for environmental allergies.  All other systems reviewed and are negative.      Objective:   Physical Exam 48 year old white female well-developed well-nourished oriented she presents at this time possibly needing the orthoses has been orthotics for several years orthotics actually seemed to fit and contour well to the beginning somewhere particular the right orthotic. Patient is a previous surgery for tarsal tunnel on her right ankle and has recently developed some increased swelling and pole on the right medial heel and arch area. This is allowing her foot to pronate or rotating to a valgus, which is applying stress to her knee with distraction the medial and compression of the lateral ligaments. Neurovascular status is intact pedal pulses are palpable DP and PT +2/4 bilateral capillary refill time 3 seconds all digits skin temperature is warm turgor normal no edema rubor pallor or varicosities incisions are healed posterior medial ankle noted posse some mild edema along the posterior tibial tendon no significant pain on direct palpation full function is noted at this time. Orthopedic exam otherwise unremarkable noncontributory to 3 changes on weightbearing noted her for orthotic may be worn somewhat in the medial post and at this time a temporary adjustment of any 3 there for 3 rubber wedge pad to the heel creating a medial or varus post. Patient will utilize this in the antrum  and this time also skin for new set of orthotics with appropriate posting 4 varus on the right degrees on the left. Patient loss use Tylenol or Advil as needed for pain in the interim she has decreased her running and walking activities because of the strain is causing on her ankle and knee at this time suggested that she may increase activities slowly over time with a new varus wedge and the orthotics eventually.       Assessment & Plan:  Assessment history of plantar fasciitis/heel spur syndrome and more recently posterior tibial tendinitis and valgus deformity of the foot which is compressing or causing angle deformity at the knee as well. At this time orthotics or just it and the orthotics are scanned will followup in 3-4 weeks with the orthotic dispense .Marland Kitchen Maintain a moderate activity level and slowly increase activities over time as tolerated recheck in 3-4 weeks as scheduled next for graft Harriet Masson DPM

## 2014-08-13 ENCOUNTER — Ambulatory Visit (INDEPENDENT_AMBULATORY_CARE_PROVIDER_SITE_OTHER): Payer: BC Managed Care – PPO

## 2014-08-13 ENCOUNTER — Other Ambulatory Visit: Payer: BC Managed Care – PPO

## 2014-08-13 DIAGNOSIS — M21071 Valgus deformity, not elsewhere classified, right ankle: Secondary | ICD-10-CM

## 2014-08-13 DIAGNOSIS — M76829 Posterior tibial tendinitis, unspecified leg: Secondary | ICD-10-CM

## 2014-08-13 DIAGNOSIS — M722 Plantar fascial fibromatosis: Secondary | ICD-10-CM

## 2014-08-13 DIAGNOSIS — M76821 Posterior tibial tendinitis, right leg: Secondary | ICD-10-CM

## 2014-08-13 DIAGNOSIS — M216X9 Other acquired deformities of unspecified foot: Secondary | ICD-10-CM

## 2014-08-13 NOTE — Progress Notes (Signed)
   Subjective:    Patient ID: Kelly Garcia, female    DOB: 1966-04-06, 48 y.o.   MRN: [id number]  HPI patient presents at this time for orthotic pickup of the orthotics adjustment bolts of orthotics.    Review of Systems no new findings or systemic changes noted at this time     Objective:   Physical Exam Patient was evaluated by Dr. Calvert Cantor who is her previous podiatrist and as well as employer for many years per patient request and after discussion with Dr. Calvert Cantor he agreed to come in and help evaluate with her previous orthotics. Patient has a very specific request and felt cocking with Dr. Calvert Cantor opinion and assistance with orthotic adjustments. I would certainly defer to his experience based on his long history with the patient at this time the orthotics are dispensed with break in wearing instructions orthotics fit and contour well patient does have posterior tibial tendinitis symptomology cannot rule out possible tear and Dr. Calvert Cantor recommended considering an MRI if symptoms don't improve significantly in the next several weeks. At this time the orthotics are dispensed and old orthotics are adjusted with rear foot varus post in the forefoot varus posting left orthotic and applied at this time. Patient will initiate new orthotics and follow up with old orthotics have been adjusted and consider reevaluation with the next month or 2 for further adjustments if needed to       Assessment & Plan:  Assessment this time a valgus deformity of with plantar fasciitis and posterior tibial tendinitis. Orthotics are dispensed orthotics are adjusted. Consider more invasive or noninvasive studies if symptoms don't improve with biomechanical support of the orthotics followup as needed with the next one to 2 months. Patient was seen and evaluated by Dr. Janus Molder at today's visit .  Harriet Masson DPM

## 2014-08-13 NOTE — Patient Instructions (Signed)

## 2015-06-06 ENCOUNTER — Other Ambulatory Visit: Payer: Self-pay | Admitting: Obstetrics and Gynecology

## 2015-06-06 DIAGNOSIS — R928 Other abnormal and inconclusive findings on diagnostic imaging of breast: Secondary | ICD-10-CM

## 2015-06-10 ENCOUNTER — Other Ambulatory Visit: Payer: Self-pay

## 2015-06-14 ENCOUNTER — Other Ambulatory Visit: Payer: Self-pay

## 2015-06-22 ENCOUNTER — Ambulatory Visit
Admission: RE | Admit: 2015-06-22 | Discharge: 2015-06-22 | Disposition: A | Discharge status: Home / Self Care | Payer: BLUE CROSS/BLUE SHIELD | Source: Ambulatory Visit | Attending: Obstetrics and Gynecology | Admitting: Obstetrics and Gynecology

## 2015-06-22 DIAGNOSIS — R928 Other abnormal and inconclusive findings on diagnostic imaging of breast: Secondary | ICD-10-CM

## 2015-10-24 ENCOUNTER — Encounter: Payer: Self-pay | Admitting: Internal Medicine

## 2015-11-28 ENCOUNTER — Encounter: Payer: Self-pay | Admitting: Internal Medicine

## 2016-03-02 ENCOUNTER — Ambulatory Visit: Payer: BLUE CROSS/BLUE SHIELD | Admitting: Podiatry

## 2016-03-16 ENCOUNTER — Ambulatory Visit (INDEPENDENT_AMBULATORY_CARE_PROVIDER_SITE_OTHER): Payer: BLUE CROSS/BLUE SHIELD | Admitting: Podiatry

## 2016-03-16 ENCOUNTER — Ambulatory Visit (INDEPENDENT_AMBULATORY_CARE_PROVIDER_SITE_OTHER): Payer: BLUE CROSS/BLUE SHIELD

## 2016-03-16 ENCOUNTER — Encounter: Payer: Self-pay | Admitting: Podiatry

## 2016-03-16 VITALS — BP 113/72 | HR 88 | Resp 12

## 2016-03-16 DIAGNOSIS — M779 Enthesopathy, unspecified: Secondary | ICD-10-CM

## 2016-03-16 DIAGNOSIS — D3610 Benign neoplasm of peripheral nerves and autonomic nervous system, unspecified: Secondary | ICD-10-CM

## 2016-03-16 DIAGNOSIS — M722 Plantar fascial fibromatosis: Secondary | ICD-10-CM

## 2016-03-16 NOTE — Progress Notes (Signed)
Subjective:    Patient ID: Kelly Garcia, female    DOB: 04/27/1966, 50 y.o.   MRN: [id number]  HPI  50 year old female presents the office today for concerns of pain to her right foot. She said that she has previously seen Dr. Mikel Cella as well as Dr. Blenda Mounts. She presented had orthotics made which are doing really well she's had multiple adjustments however she feels that may be worn out in she started have pain to her foot. She states that she has had surgery for tarsal tunnel as well as tenosynovectomy of the right ankle. She is also follow-up with Dr. Doran Durand recently as well for ankle pain. Discussed surgery however she does not have the 6 month recovery. She is also had 6 knee surgeries on the right side. She states that her foot is started to hurt on the balls of the foot into the heel and she has plantar fasciitis. No recent injury or trauma. She denies any tingling or numbness. No other complaints at this time.  Review of Systems  Musculoskeletal: Positive for gait problem.       Objective:   Physical Exam General: AAO x3, NAD  Dermatological: Skin is warm, dry and supple bilateral. Nails x 10 are well manicured; remaining integument appears unremarkable at this time. There are no open sores, no preulcerative lesions, no rash or signs of infection present.  Vascular: Dorsalis Pedis artery and Posterior Tibial artery pedal pulses are 2/4 bilateral with immedate capillary fill time. Pedal hair growth present. No varicosities and no lower extremity edema present bilateral. There is no pain with calf compression, swelling, warmth, erythema.   Neruologic: Grossly intact via light touch bilateral. Vibratory intact via tuning fork bilateral. Protective threshold with Semmes Wienstein monofilament intact to all pedal sites bilateral. Patellar and Achilles deep tendon reflexes 2+ bilateral. No Babinski or clonus noted bilateral.   Musculoskeletal: There is a decrease in medial arch upon  weightbearing. There is mild topalpation of the plantar medial tubercle of the calcaneus at insertion the plantar fascia. There is no pain   along the plantar fascial within the arch of the foot. Plantar fascial tears me intact. There is no pain lateral compression of calcaneus. No pain on the Achilles tendon. There is prominence the metatarsal heads and atrophy of the fat pad mildly. There is tenderness of the third interspace and a small masses palpable which may represent a neuroma versus bursitis. There is no specific area pinpoint bony tenderness or pain the vibratory sensation. Upon evaluation for orthotics is been multiple modifications. She does rule out to the outside aspect of the orthotic in the heel given the arch support.  Gait: Unassisted, Nonantalgic.     Assessment & Plan:   50 year old female with right foot pain, plantar fasciitis, likely neuroma/metatarsalgia  -Treatment options discussed including all alternatives, risks, and complications -X-rays were obtained and reviewed with the patient.  -Etiology of symptoms were discussed -At this time I do recommend new set of orthotics as hers appear to be worn out mostly in the heel. With the shoe to well with her similar orthotic that was made for her previously with addition of a metatarsal pad as well as a deep heel cup to help her from rolling outwards. She was casted for the next there were sent to [facility] labs.  -Discussed steroid injection but she wishes to hold off. She will use some anti-inflammatory condition artery has for now. If symptoms persist we'll consider steroid injection.  -Follow-up  in 3 weeks' the orthotics or sooner if he issues are to arise. Call if questions or concerns meantime.   Celesta Gentile, DPM

## 2016-04-13 ENCOUNTER — Ambulatory Visit (INDEPENDENT_AMBULATORY_CARE_PROVIDER_SITE_OTHER): Payer: BLUE CROSS/BLUE SHIELD | Admitting: Podiatry

## 2016-04-13 DIAGNOSIS — M722 Plantar fascial fibromatosis: Secondary | ICD-10-CM

## 2016-04-13 NOTE — Progress Notes (Signed)
Patient ID: Kelly Garcia, female   DOB: 05-20-1966, 50 y.o.   MRN: [id number] Patient presents for orthotic pick up.  Patient already stating she does not like orthotics Verbal and written break in and wear instructions given.  Patient states that she wanted to be seen by myself.  I offered to see the patient however I was currently seeing another patient at the time she came in. Because of this she did leave the office today without me seeing her.

## 2016-04-13 NOTE — Patient Instructions (Signed)

## 2016-06-06 ENCOUNTER — Other Ambulatory Visit: Payer: Self-pay | Admitting: Obstetrics and Gynecology

## 2016-06-06 DIAGNOSIS — Z1231 Encounter for screening mammogram for malignant neoplasm of breast: Secondary | ICD-10-CM

## 2016-07-06 ENCOUNTER — Ambulatory Visit: Payer: BLUE CROSS/BLUE SHIELD

## 2016-07-11 ENCOUNTER — Other Ambulatory Visit: Payer: Self-pay | Admitting: Gastroenterology

## 2016-07-11 DIAGNOSIS — R1011 Right upper quadrant pain: Secondary | ICD-10-CM

## 2016-07-12 ENCOUNTER — Ambulatory Visit
Admission: RE | Admit: 2016-07-12 | Discharge: 2016-07-12 | Disposition: A | Discharge status: Home / Self Care | Payer: BLUE CROSS/BLUE SHIELD | Source: Ambulatory Visit | Attending: Gastroenterology | Admitting: Gastroenterology

## 2016-07-12 DIAGNOSIS — R1011 Right upper quadrant pain: Secondary | ICD-10-CM

## 2016-09-27 ENCOUNTER — Ambulatory Visit: Payer: BLUE CROSS/BLUE SHIELD

## 2016-10-15 ENCOUNTER — Ambulatory Visit: Payer: BLUE CROSS/BLUE SHIELD

## 2016-10-16 ENCOUNTER — Ambulatory Visit: Payer: BLUE CROSS/BLUE SHIELD

## 2016-11-05 ENCOUNTER — Ambulatory Visit
Admission: RE | Admit: 2016-11-05 | Discharge: 2016-11-05 | Disposition: A | Discharge status: Home / Self Care | Payer: BLUE CROSS/BLUE SHIELD | Source: Ambulatory Visit | Attending: Obstetrics and Gynecology | Admitting: Obstetrics and Gynecology

## 2016-11-05 DIAGNOSIS — Z1231 Encounter for screening mammogram for malignant neoplasm of breast: Secondary | ICD-10-CM

## 2017-01-14 ENCOUNTER — Ambulatory Visit (INDEPENDENT_AMBULATORY_CARE_PROVIDER_SITE_OTHER): Payer: BLUE CROSS/BLUE SHIELD | Admitting: Podiatry

## 2017-01-14 ENCOUNTER — Encounter: Payer: Self-pay | Admitting: Podiatry

## 2017-01-14 DIAGNOSIS — S8991XA Unspecified injury of right lower leg, initial encounter: Secondary | ICD-10-CM

## 2017-01-14 DIAGNOSIS — D3610 Benign neoplasm of peripheral nerves and autonomic nervous system, unspecified: Secondary | ICD-10-CM | POA: Diagnosis not present

## 2017-01-14 DIAGNOSIS — S99921A Unspecified injury of right foot, initial encounter: Secondary | ICD-10-CM

## 2017-01-17 DIAGNOSIS — D3610 Benign neoplasm of peripheral nerves and autonomic nervous system, unspecified: Secondary | ICD-10-CM | POA: Insufficient documentation

## 2017-01-17 DIAGNOSIS — S99921A Unspecified injury of right foot, initial encounter: Secondary | ICD-10-CM | POA: Insufficient documentation

## 2017-01-17 NOTE — Progress Notes (Signed)
Subjective: Kelly Garcia presents the office today for concerns of right foot pain. She states her third toe is starting to drift over and she is getting pain in the ball of her foot. She believes that she has a plantar plate tear as a result of the way she is walking. She has sort orthotic to help build up the medial arch to help with her ankle but she feels that this is causing the pressure to drift outwards putting pressure to the toe causing the plantar plate tear. She has been seen orthopedics for this, Dr. Doran Durand. She also had an MRI performed that she brings in to the office today. She has had one steroid injection to the area which provided minimal relief. Her orthotics are doing well. She also states that she is having multiple knee surgeries as a result of the way she is walking with her foot and "nobody can convince me otherwise".  Denies any systemic complaints such as fevers, chills, nausea, vomiting. No acute changes since last appointment, and no other complaints at this time.   Objective: AAO x3, NAD DP/PT pulses palpable bilaterally, CRT less than 3 seconds There is mild tenderness to palpation along the plantar aspect of the right third MTPJ on the plantar plate. There is no gross instability of the MPJ present. There is minimal edema to this area there is no erythema or increase in warmth. Probable neuroma is present in the third interspace. There is no area pinpoint bony tenderness or pain the vibratory sensation. This carcinoma prior surgery well healed. There is a decrease in medial arch height upon weightbearing. No open lesions or pre-ulcerative lesions.  No pain with calf compression, swelling, warmth, erythema  Assessment: 51 year old female with right third partial tearing of the plantar plate; neuroma  Plan: -All treatment options discussed with the patient including all alternatives, risks, complications.  -Previous x-rays were reviewed as well as the MRI with the patient. I  believe that the reason why she is having less stress to the third MPJ is due to her metatarsal parabola and the third metatarsal slightly elongated. She's had a previous short nostril in the second. Given the plantar plate injury of the brace this and the plantar flexed position to help this heal. This was dispensed to her today. Discussed within the future that if symptoms continue discuss surgical intervention. Discussed the most likely third metatarsal shortening osteotomy with plantar plate repair and neuroma excision. -Orthotics are doing well. Discussed modifying them to help her from rolling outwards but she is concerned that by doing this will hurt her knee or ankle. We held off on this today. -Patient encouraged to call the office with any questions, concerns, change in symptoms.   Celesta Gentile, DPM

## 2017-02-18 ENCOUNTER — Ambulatory Visit: Payer: BLUE CROSS/BLUE SHIELD | Admitting: Podiatry

## 2017-03-06 ENCOUNTER — Encounter: Payer: Self-pay | Admitting: Podiatry

## 2017-03-06 ENCOUNTER — Ambulatory Visit (INDEPENDENT_AMBULATORY_CARE_PROVIDER_SITE_OTHER): Payer: BLUE CROSS/BLUE SHIELD | Admitting: Podiatry

## 2017-03-06 DIAGNOSIS — S8991XA Unspecified injury of right lower leg, initial encounter: Secondary | ICD-10-CM

## 2017-03-06 DIAGNOSIS — S99921A Unspecified injury of right foot, initial encounter: Secondary | ICD-10-CM

## 2017-03-06 DIAGNOSIS — M2041 Other hammer toe(s) (acquired), right foot: Secondary | ICD-10-CM | POA: Diagnosis not present

## 2017-03-06 DIAGNOSIS — D3610 Benign neoplasm of peripheral nerves and autonomic nervous system, unspecified: Secondary | ICD-10-CM

## 2017-03-06 DIAGNOSIS — M216X1 Other acquired deformities of right foot: Secondary | ICD-10-CM

## 2017-03-06 NOTE — Patient Instructions (Signed)

## 2017-03-08 ENCOUNTER — Telehealth: Payer: Self-pay | Admitting: *Deleted

## 2017-03-08 NOTE — Telephone Encounter (Signed)
"  I was in to see Dr. Jacqualyn Posey on yesterday.  I got a surgery date of April 4.  I wanted to double check with you to make sure you have that down.  Please give me a call."  I am returning your call.  I do have you scheduled for April 4.  "Okay, what do I need to do now?"  You should have received a brochure from the surgical center.  In that brochure it tells you how to go online to register with the surgical center.  You will receive a call from someone at the surgical center a day or two prior to surgery date with the arrival time.  "Okay, thank you so much."

## 2017-03-11 NOTE — Progress Notes (Signed)
Subjective: Ms. Wetsel presents the office today for concerns of right foot pain. She states that her right foot still her since she was to go ahead and proceed with surgical intervention to help with her pain. The majority pain that she gets is along the bottom of the right third toe as well as she has noticed her toe is been drifting to the side, medial. She also continues to get numbness and tingling to the third and fourth toes and neuroma to the third interspace. Recently she started to develop a corn to the lateral aspect of the right fourth toe as well as his been painful with pressure in shoes. She has tried offloading padding without any relief. At this time she is attended multiple conservative treatments for these including immobilization, bracing, injections without any relief of symptoms and she wishes to pursue a surgical intervention.  Denies any systemic complaints such as fevers, chills, nausea, vomiting. No acute changes since last appointment, and no other complaints at this time.   Objective: AAO x3, NAD DP/PT pulses palpable bilaterally, CRT less than 3 seconds There is continued tenderness to palpation along the plantar aspect of the right third MTPJ on the plantar plate. There is mild subluxation dorsally at the third MTPJ. The toe was drifted medially. There is tenderness of third interspace consistent with a neuroma problem neuroma is identified. She does symptoms of ligamentous and tingling to the third and fourth toes as well. There is also adductovarus mild of the fourth digit is mild hyperkeratotic lesion to the lateral fourth PIPJ. Is here he is tender with pressure in shoes. There is no other areas of tenderness identified bilaterally. No pain with calf compression, swelling, warmth, erythema  Assessment: 51 year old female with right third partial tearing of the plantar plate; neuroma; hammertoe resulted hyperkeratotic tissue  Plan: -All treatment options discussed with  the patient including all alternatives, risks, complications.  -I again review the chart I discussed with her surgical versus conservative treatment. This time she wishes to proceed with surgical intervention. -I discussed with her third plantar plate repair, third metatarsal osteotomy, neuroma excision third interspace, arthroplasty fourth digit. I discussed the surgery as well as the risks and postoperative course. -The incision placement as well as the postoperative course was discussed with the patient. I discussed risks of the surgery which include, but not limited to, infection, bleeding, pain, swelling, need for further surgery, delayed or nonhealing, painful or ugly scar, numbness or sensation changes, over/under correction, recurrence, transfer lesions, further deformity, hardware failure, DVT/PE, loss of toe/foot. Patient understands these risks and wishes to proceed with surgery. The surgical consent was reviewed with the patient all 3 pages were signed. No promises or guarantees were given to the outcome of the procedure. All questions were answered to the best of my ability. Before the surgery the patient was encouraged to call the office if there is any further questions. The surgery will be performed at the [facility] on an outpatient basis.  Celesta Gentile, DPM

## 2017-03-20 ENCOUNTER — Encounter: Payer: Self-pay | Admitting: Podiatry

## 2017-03-20 DIAGNOSIS — G5761 Lesion of plantar nerve, right lower limb: Secondary | ICD-10-CM | POA: Diagnosis not present

## 2017-03-20 DIAGNOSIS — M2041 Other hammer toe(s) (acquired), right foot: Secondary | ICD-10-CM | POA: Diagnosis not present

## 2017-03-20 DIAGNOSIS — S93149S Subluxation of metatarsophalangeal joint of unspecified toe(s), sequela: Secondary | ICD-10-CM | POA: Diagnosis not present

## 2017-03-20 DIAGNOSIS — M21541 Acquired clubfoot, right foot: Secondary | ICD-10-CM | POA: Diagnosis not present

## 2017-03-25 ENCOUNTER — Encounter: Payer: Self-pay | Admitting: Podiatry

## 2017-03-25 ENCOUNTER — Ambulatory Visit (INDEPENDENT_AMBULATORY_CARE_PROVIDER_SITE_OTHER): Payer: BLUE CROSS/BLUE SHIELD

## 2017-03-25 ENCOUNTER — Ambulatory Visit (INDEPENDENT_AMBULATORY_CARE_PROVIDER_SITE_OTHER): Payer: Self-pay | Admitting: Podiatry

## 2017-03-25 DIAGNOSIS — S99921A Unspecified injury of right foot, initial encounter: Secondary | ICD-10-CM

## 2017-03-25 DIAGNOSIS — M216X1 Other acquired deformities of right foot: Secondary | ICD-10-CM

## 2017-03-25 DIAGNOSIS — Z9889 Other specified postprocedural states: Secondary | ICD-10-CM

## 2017-03-25 DIAGNOSIS — S8991XA Unspecified injury of right lower leg, initial encounter: Secondary | ICD-10-CM

## 2017-03-25 DIAGNOSIS — D3610 Benign neoplasm of peripheral nerves and autonomic nervous system, unspecified: Secondary | ICD-10-CM

## 2017-03-25 DIAGNOSIS — M2041 Other hammer toe(s) (acquired), right foot: Secondary | ICD-10-CM

## 2017-03-25 MED ORDER — ENOXAPARIN SODIUM 40 MG/0.4ML ~~LOC~~ SOLN: 40.0000 mg | SUBCUTANEOUS | 0 refills | Status: DC

## 2017-03-25 MED ORDER — OXYCODONE-ACETAMINOPHEN 5-325 MG PO TABS
1.0000 | ORAL_TABLET | Freq: Four times a day (QID) | ORAL | 0 refills | Status: DC | PRN
Start: 2017-03-25 — End: 2017-04-04

## 2017-03-26 ENCOUNTER — Encounter: Payer: Self-pay | Admitting: Podiatry

## 2017-03-27 NOTE — Progress Notes (Signed)
Subjective: Kelly Garcia is a 51 y.o. is seen today in office s/p right 3rd metatarsal osteotomy, plantar plate repair, neuroma excision and hammertoe repair to the 4th toe preformed on 03/20/17. They state their pain is controlled. She tries to take only tramadol if needed but she is asking for refill the Percocet just in case she needs it. She states that she has been pretty immobile in bed. She is asking about aspirin versus other blood thinners. Denies any systemic complaints such as fevers, chills, nausea, vomiting. No calf pain, chest pain, shortness of breath.   Objective: General: No acute distress, AAOx3  DP/PT pulses palpable 2/4, CRT < 3 sec to all digits.  Protective sensation intact. Motor function intact.  Right foot: Incision is well coapted without any evidence of dehiscence and sutures are intact. There is no surrounding erythema, ascending cellulitis, fluctuance, crepitus, malodor, drainage/purulence. There is mild edema around the surgical site. There is mild pain along the surgical site. Toes are in a rectus position. No other areas of tenderness to bilateral lower extremities.  No other open lesions or pre-ulcerative lesions.  No pain with calf compression, swelling, warmth, erythema.   Assessment and Plan:  Status post right foot surgery, doing well with no complications   -Treatment options discussed including all alternatives, risks, and complications -X-rays were obtained and reviewed with the patient. Status post third metatarsal osteotomy with hardware intact. Old ostomy to the second metatarsal. There is a more normal metatarsal parabola today. No evidence of acute fracture identified otherwise. -Antibiotic ointment was applied followed by a bandage. Keep the dressing clean, dry, intact. -Ice/elevation -Pain medication as needed. Refilled percocet today. Use only prn and not to combine with any other pain medication. -Given her history of blood clots and she has not  been mobile much will do Lovenox for prophylaxis. Discussed side affects of the medication and risks.  -Monitor for any clinical signs or symptoms of infection and DVT/PE and directed to call the office immediately should any occur or go to the ER. -Follow-up in 10 days or sooner if any problems arise. In the meantime, encouraged to call the office with any questions, concerns, change in symptoms.   Celesta Gentile, DPM

## 2017-04-04 ENCOUNTER — Ambulatory Visit (INDEPENDENT_AMBULATORY_CARE_PROVIDER_SITE_OTHER): Payer: BLUE CROSS/BLUE SHIELD

## 2017-04-04 ENCOUNTER — Ambulatory Visit (INDEPENDENT_AMBULATORY_CARE_PROVIDER_SITE_OTHER): Payer: BLUE CROSS/BLUE SHIELD | Admitting: Podiatry

## 2017-04-04 ENCOUNTER — Encounter: Payer: Self-pay | Admitting: Podiatry

## 2017-04-04 DIAGNOSIS — S99921A Unspecified injury of right foot, initial encounter: Secondary | ICD-10-CM

## 2017-04-04 DIAGNOSIS — S8991XA Unspecified injury of right lower leg, initial encounter: Secondary | ICD-10-CM

## 2017-04-04 DIAGNOSIS — Z9889 Other specified postprocedural states: Secondary | ICD-10-CM | POA: Diagnosis not present

## 2017-04-04 DIAGNOSIS — M216X1 Other acquired deformities of right foot: Secondary | ICD-10-CM

## 2017-04-04 DIAGNOSIS — D3610 Benign neoplasm of peripheral nerves and autonomic nervous system, unspecified: Secondary | ICD-10-CM

## 2017-04-04 MED ORDER — OXYCODONE-ACETAMINOPHEN 5-325 MG PO TABS: 1.0000 | ORAL_TABLET | Freq: Four times a day (QID) | ORAL | 0 refills | Status: DC | PRN

## 2017-04-04 NOTE — Progress Notes (Signed)
Subjective: Kelly Garcia is a 51 y.o. is seen today in office s/p right 3rd metatarsal osteotomy, plantar plate repair, neuroma excision and hammertoe repair to the 4th toe preformed on 03/20/17. They state their pain is controlled. All sharp pains to her foot she feels that this is the nerves waking back up. Is intermittent in nature and not continuous. She is continued on Lovenox. She's taking Percocet for pain not taking tramadol. She's tried putting some weight on her foot but due to the stitches in the bottom she has been wearing off. She has been trying to range of motion exercises for ankle as well as Qvar calf moving. Denies any systemic complaints such as fevers, chills, nausea, vomiting. No calf pain, chest pain, shortness of breath.   Objective: General: No acute distress, AAOx3  DP/PT pulses palpable 2/4, CRT < 3 sec to all digits.  Protective sensation intact. Motor function intact.  Right foot: Incision is well coapted without any evidence of dehiscence and sutures are intact. There is no surrounding erythema, ascending cellulitis, fluctuance, crepitus, malodor, drainage/purulence. There is mild edema around the surgical site. There is mild pain along the surgical site. Toes are in a rectus position. No other areas of tenderness to bilateral lower extremities. There is mild movement across the incision sites. No other open lesions or pre-ulcerative lesions.  No pain with calf compression, swelling, warmth, erythema.   Assessment and Plan:  Status post right foot surgery, doing well with no complications   -Treatment options discussed including all alternatives, risks, and complications -X-rays were obtained and reviewed with the patient. Status post third metatarsal osteotomy with hardware intact. Old ostomy to the second metatarsal. There is a more normal metatarsal parabola today. No evidence of acute fracture identified otherwise. -Due to mild movement across incision every other  suture was removed on the dorsal and the remainder of the plantar sutures remain intact. Antibiotic ointment was applied followed by a bandage. Keep the dressing clean, dry, intact. -Ice/elevation -Pain medication as needed. Refilled percocet today. Use only prn and not to combine with any other pain medication. -Given her history of blood clots and she has not been mobile much will do Lovenox for prophylaxis. Discussed side affects of the medication and risks.  -Finish Lovenox but since she is moving around more and doing more range of motion exercises and taking the boot off to move her ankle we will stop the Lovenox and return to aspirin which finishes the course. -Monitor for any clinical signs or symptoms of infection and DVT/PE and directed to call the office immediately should any occur or go to the ER. -Follow-up in 1 week or sooner if any problems arise. In the meantime, encouraged to call the office with any questions, concerns, change in symptoms.   Celesta Gentile, DPM

## 2017-04-12 ENCOUNTER — Ambulatory Visit (INDEPENDENT_AMBULATORY_CARE_PROVIDER_SITE_OTHER): Payer: BLUE CROSS/BLUE SHIELD | Admitting: Podiatry

## 2017-04-12 DIAGNOSIS — S99921A Unspecified injury of right foot, initial encounter: Secondary | ICD-10-CM

## 2017-04-12 DIAGNOSIS — Z9889 Other specified postprocedural states: Secondary | ICD-10-CM

## 2017-04-12 DIAGNOSIS — M216X1 Other acquired deformities of right foot: Secondary | ICD-10-CM

## 2017-04-12 DIAGNOSIS — D3610 Benign neoplasm of peripheral nerves and autonomic nervous system, unspecified: Secondary | ICD-10-CM

## 2017-04-12 DIAGNOSIS — S8991XA Unspecified injury of right lower leg, initial encounter: Secondary | ICD-10-CM

## 2017-04-15 NOTE — Progress Notes (Signed)
Subjective: Kelly Garcia is a 51 y.o. is seen today in office s/p right 3rd metatarsal osteotomy, plantar plate repair, neuroma excision and hammertoe repair to the 4th toe preformed on 03/20/17. She presents today for suture removal. She also states that because of her other issues to her ankle she is inquiring up in an orthotic inside the cam boot to help give her support. She states that the nerve pain that she has been experiencing is improving his bone. She has been weightbearing in the cam boot. Denies any systemic complaints such as fevers, chills, nausea, vomiting. No calf pain, chest pain, shortness of breath.   Objective: General: No acute distress, AAOx3  DP/PT pulses palpable 2/4, CRT < 3 sec to all digits.  Protective sensation intact. Motor function intact.  Right foot: Incision is well coapted without any evidence of dehiscence and sutures are intact. There is no surrounding erythema, ascending cellulitis, fluctuance, crepitus, malodor, drainage/purulence. There is minimal edema around the surgical site. There is mild pain along the surgical site. Toes are in a rectus position. No other areas of tenderness to bilateral lower extremities. There is mild movement across the incision sites. No other open lesions or pre-ulcerative lesions.  No pain with calf compression, swelling, warmth, erythema.   Assessment and Plan:  Status post right foot surgery, doing well with no complications   -Treatment options discussed including all alternatives, risks, and complications -Sutures removed today. Steri-Strips are applied followed by antibiotic ointment and bandage. Keep dressing clean, dry, intact. -Continue ice and elevation -Continue cam boot. She did cut an old orthotic evidence of the boot and I'm okay with her doing this. -Monitor for any clinical signs or symptoms of infection and directed to call the office immediately should any occur or go to the ER. -She is WBAT and able to do so  in the CAM boot.  -Follow-up in 1 week or sooner if any problems arise. In the meantime, encouraged to call the office with any questions, concerns, change in symptoms.   Celesta Gentile, DPM

## 2017-04-19 ENCOUNTER — Ambulatory Visit (INDEPENDENT_AMBULATORY_CARE_PROVIDER_SITE_OTHER): Payer: BLUE CROSS/BLUE SHIELD | Admitting: Podiatry

## 2017-04-19 DIAGNOSIS — Z9889 Other specified postprocedural states: Secondary | ICD-10-CM

## 2017-04-19 DIAGNOSIS — S99921A Unspecified injury of right foot, initial encounter: Secondary | ICD-10-CM

## 2017-04-19 DIAGNOSIS — D3610 Benign neoplasm of peripheral nerves and autonomic nervous system, unspecified: Secondary | ICD-10-CM

## 2017-04-19 DIAGNOSIS — M216X1 Other acquired deformities of right foot: Secondary | ICD-10-CM

## 2017-04-19 DIAGNOSIS — S8991XA Unspecified injury of right lower leg, initial encounter: Secondary | ICD-10-CM

## 2017-04-24 NOTE — Progress Notes (Signed)
Subjective: Kelly Garcia is a 51 y.o. is seen today in office s/p right 3rd metatarsal osteotomy, plantar plate repair, neuroma excision and hammertoe repair to the 4th toe preformed on 03/20/17.  She presents today remove the remainder the stitches. She said that she is doing well and her pain is more controlled. She was very active last week and as her daughter is moving out of her dorm and she was going up and down steps quite a bit and a CAM boot. She is able to just minimal discomfort. She has no new concerns today other than she is asking she complained higher full length orthotic in the cam boot to help support the ankle more. She also requesting a new cam boot today. Denies any systemic complaints such as fevers, chills, nausea, vomiting. No calf pain, chest pain, shortness of breath.   Objective: General: No acute distress, AAOx3  DP/PT pulses palpable 2/4, CRT < 3 sec to all digits.  Protective sensation intact. Motor function intact.  Right foot: Incision is well coapted without any evidence of dehiscence and half of the sutures are intact to the dorsal incision and plantar incision. There is no surrounding erythema, ascending cellulitis, fluctuance, crepitus, malodor, drainage/purulence. There is minimal edema around the surgical site. There is mild pain along the surgical site. Toes are in a rectus position. No other areas of tenderness to bilateral lower extremities. There is mild movement across the incision sites. No other open lesions or pre-ulcerative lesions.  No pain with calf compression, swelling, warmth, erythema.   Assessment and Plan:  Status post right foot surgery, doing well with no complications   -Treatment options discussed including all alternatives, risks, and complications -The remainder the dorsal sutures removed today. I did leave the plantar stitches intact because she has been more active on her foot and what to make sure this heals appropriately. And about  equinus was applied followed by a dressing. She has her to shower over the weekend as well as incisions remain well healed. Afterwards applying antibiotic ointment. -Continue cam boot at all times and he was dispensed today. I did apply the orthotic in the cam boot today. -Limit activity. -She is WBAT and able to do so in the CAM boot.  -Follow-up as scheduled or sooner if any problems arise. In the meantime, encouraged to call the office with any questions, concerns, change in symptoms.   Celesta Gentile, DPM

## 2017-04-25 ENCOUNTER — Ambulatory Visit (INDEPENDENT_AMBULATORY_CARE_PROVIDER_SITE_OTHER): Payer: Self-pay | Admitting: Podiatry

## 2017-04-25 DIAGNOSIS — S8991XA Unspecified injury of right lower leg, initial encounter: Secondary | ICD-10-CM

## 2017-04-25 DIAGNOSIS — D3610 Benign neoplasm of peripheral nerves and autonomic nervous system, unspecified: Secondary | ICD-10-CM

## 2017-04-25 DIAGNOSIS — M216X1 Other acquired deformities of right foot: Secondary | ICD-10-CM

## 2017-04-25 DIAGNOSIS — Z9889 Other specified postprocedural states: Secondary | ICD-10-CM

## 2017-04-25 DIAGNOSIS — S99921A Unspecified injury of right foot, initial encounter: Secondary | ICD-10-CM

## 2017-04-29 NOTE — Progress Notes (Signed)
Subjective: Kelly Garcia is a 51 y.o. is seen today in office s/p right 3rd metatarsal osteotomy, plantar plate repair, neuroma excision and hammertoe repair to the 4th toe preformed on 03/20/17. She presents today remove the sutures and the plantar aspect of the foot. She states that overall she is doing well and her pain is controlled. She has some irritation in between her fourth and fifth toes on the previous warmth is slowly improving as well. Wearing the full length orthotic in the cam boot is also been helping her ankle pain.  Denies any systemic complaints such as fevers, chills, nausea, vomiting. No calf pain, chest pain, shortness of breath.   Objective: General: No acute distress, AAOx3  DP/PT pulses palpable 2/4, CRT < 3 sec to all digits.  Protective sensation intact. Motor function intact.  Right foot: Incision is well coapted without any evidence of dehiscence and the dorsal incisions are all healing well a scar is forming. Also there is no movement across the plantar incision there is a few sutures which remained. There is no drainage or pus in there is no significant edema, erythema, increase in warmth. No open lesions or pre-ulcerative lesions are identified otherwise. No pain with calf compression, swelling, warmth, erythema.   Assessment and Plan:  Status post right foot surgery, doing well with no complications   -Treatment options discussed including all alternatives, risks, and complications -I removed the remainder the sutures today to the plantar aspect of the foot. Antibiotic ointment and a bandage was applied. Continue daily dressing changes. She desired to shower this weekend. -Continue in cam boot. Weightbearing as tolerated. -Ice and elevation -Pain medication as needed. -Follow-up as scheduled or sooner if any problems arise. In the meantime, encouraged to call the office with any questions, concerns, change in symptoms.   Celesta Gentile, DPM

## 2017-05-10 ENCOUNTER — Ambulatory Visit (INDEPENDENT_AMBULATORY_CARE_PROVIDER_SITE_OTHER): Payer: BLUE CROSS/BLUE SHIELD | Admitting: Podiatry

## 2017-05-10 ENCOUNTER — Ambulatory Visit (INDEPENDENT_AMBULATORY_CARE_PROVIDER_SITE_OTHER): Payer: BLUE CROSS/BLUE SHIELD

## 2017-05-10 DIAGNOSIS — M216X1 Other acquired deformities of right foot: Secondary | ICD-10-CM

## 2017-05-10 DIAGNOSIS — S8991XA Unspecified injury of right lower leg, initial encounter: Secondary | ICD-10-CM

## 2017-05-10 DIAGNOSIS — D3610 Benign neoplasm of peripheral nerves and autonomic nervous system, unspecified: Secondary | ICD-10-CM

## 2017-05-10 DIAGNOSIS — Z9889 Other specified postprocedural states: Secondary | ICD-10-CM

## 2017-05-10 DIAGNOSIS — S99921A Unspecified injury of right foot, initial encounter: Secondary | ICD-10-CM

## 2017-05-16 NOTE — Progress Notes (Signed)
Subjective: Kelly Garcia is a 51 y.o. is seen today in office s/p right 3rd metatarsal osteotomy, plantar plate repair, neuroma excision and hammertoe repair to the 4th toe preformed on 03/20/17. Overall she stated that she is doing well she's having no concerns today. She is continuing the CAM boot. She has been on her feet and been active. She presents today for follow-up evaluation. She does continue with CAM boot.  Denies any systemic complaints such as fevers, chills, nausea, vomiting. No calf pain, chest pain, shortness of breath.   Objective: General: No acute distress, AAOx3  DP/PT pulses palpable 2/4, CRT < 3 sec to all digits.  Protective sensation intact. Motor function intact.  Right foot: Incisions are well coapted without any evidence of dehiscence and the dorsal incisions are all healing well a scar is forming. The toe sitting versus position. Probably concern is still some minimal hyperkeratotic tissue present along the lateral facet of the fourth digit. I she'll be debrided this and there is no overlying ulceration or drainage or any signs of infection as appears to be much more minimal compared to what it was prior to surgery. There is no bony prominence and I'm able to palpate states of this area so hopefully this will resolve. This seems been her only area of concern upon exam. No open lesions or pre-ulcerative lesions are identified otherwise. No pain with calf compression, swelling, warmth, erythema.   Assessment and Plan:  Status post right foot surgery, doing well with no complications   -Treatment options discussed including all alternatives, risks, and complications -X-rays were obtained and reviewed with the patient. Status post arthroplasty right fourth toe as well as metatarsal osteotomy to the third digit. No evidence of acute fracture. -Sharply debrided hyperkeratotic lesion the lateral aspect of the right fourth toe. Dispensed multiple offloading pads for her. -I  recommended physical therapy and prescriptions provided today for [facility].  -Discussed she can transition to a regular shoe as tolerated over the next couple of weeks.  -Follow-up as scheduled or sooner if any problems arise. In the meantime, encouraged to call the office with any questions, concerns, change in symptoms.   Celesta Gentile, DPM

## 2017-05-17 NOTE — Progress Notes (Signed)
DOS 03/20/2017 Right foot removal of neuroma 3rd interspace: repair of plantar plate 3rd: Shortening of 3rd Metatarsal hammertoe repair 4th toe

## 2017-05-31 ENCOUNTER — Ambulatory Visit: Payer: BLUE CROSS/BLUE SHIELD | Admitting: Podiatry

## 2017-06-03 ENCOUNTER — Ambulatory Visit: Payer: BLUE CROSS/BLUE SHIELD | Admitting: Podiatry

## 2017-06-27 ENCOUNTER — Ambulatory Visit (INDEPENDENT_AMBULATORY_CARE_PROVIDER_SITE_OTHER): Payer: Self-pay | Admitting: Podiatry

## 2017-06-27 ENCOUNTER — Encounter: Payer: Self-pay | Admitting: Podiatry

## 2017-06-27 DIAGNOSIS — M2041 Other hammer toe(s) (acquired), right foot: Secondary | ICD-10-CM

## 2017-06-27 DIAGNOSIS — Z9889 Other specified postprocedural states: Secondary | ICD-10-CM

## 2017-06-30 NOTE — Progress Notes (Signed)
Subjective: Kelly Garcia is a 51 y.o. is seen today in office s/p right 3rd metatarsal osteotomy, plantar plate repair, neuroma excision and hammertoe repair to the 4th toe preformed on 03/20/17. She presents today wearing a flip-flop. She states that she is doing well and the surgery is doing very well she's having no issues except for the right fourth toe which is still somewhat swollen and does cause pressure to the other toes which aggravates her towards the end of the day. This is her only concern today. Denies any systemic complaints such as fevers, chills, nausea, vomiting. No calf pain, chest pain, shortness of breath.   Objective: General: No acute distress, AAOx3  DP/PT pulses palpable 2/4, CRT < 3 sec to all digits.  Protective sensation intact. Motor function intact.  Right foot: Incisions are well coapted without any evidence of dehiscence and the dorsal incisions are all healing well a scar is forming. Toes are more rectus position. There is no area of tenderness on the surgical sites except for the right fourth toe in which there is mild tenderness as well as prominence along the lateral aspect of the proximal phalanx. There is no hyperkeratotic lesion identified. There is no erythema. There is mild edema to the digit. There is no other area tenderness involving this time. No open lesions or pre-ulcerative lesions are identified otherwise. No pain with calf compression, swelling, warmth, erythema.   Assessment and Plan:  Status post right foot surgery, mild pain to the fourth toe however doing well otherwise  -Treatment options discussed including all alternatives, risks, and complications -At this point she is in a flip-flop and she is not able to go back into a regular shoe full time given the fourth toe. I discussed with the continue taping to help the swelling as her still some mild swelling. Although I do feel a prominence on the lateral aspect over this will dissipate some of the  swelling continues to improve. Discussed with her that even after the swelling decreases there'll still be some prominence. However at this point likely the swelling to completely resolve or come down more and give it some more time to see how she does before proceeding with any further invasive treatment. Ultimately she may need to have some more bone taken out of the fourth toe in order to eliminate the pressure. -Discussed she can transition to a regular shoe as tolerated over the next couple of weeks.  -Follow-up as scheduled or sooner if any problems arise. In the meantime, encouraged to call the office with any questions, concerns, change in symptoms.   Celesta Gentile, DPM

## 2017-08-29 ENCOUNTER — Ambulatory Visit (INDEPENDENT_AMBULATORY_CARE_PROVIDER_SITE_OTHER): Payer: BLUE CROSS/BLUE SHIELD | Admitting: Podiatry

## 2017-08-29 ENCOUNTER — Encounter: Payer: Self-pay | Admitting: Podiatry

## 2017-08-29 DIAGNOSIS — M898X9 Other specified disorders of bone, unspecified site: Secondary | ICD-10-CM

## 2017-08-30 ENCOUNTER — Telehealth: Payer: Self-pay | Admitting: *Deleted

## 2017-08-30 NOTE — Progress Notes (Signed)
Subjective: Kelly Garcia presents today for concerns of continued pain to the right fourth toe. She states that when she stands the toe still curls and she gets pain to the outside aspect of the fourth toe which she points on the PIPJ laterally. She states this area continues to rub on her fifth toe and she has a lot of pain to the area. She has to wear open toed shoes because of this. She is also been trying offloading pads that she states that putting more pressure in her symptoms worse. Otherwise she states that she is doing well she's having no pain to her right foot or ankle this point. Denies any systemic complaints such as fevers, chills, nausea, vomiting. No acute changes since last appointment, and no other complaints at this time.   Objective: AAO x3, NAD DP/PT pulses palpable bilaterally, CRT less than 3 seconds Incision from prior surgery well healed. There is no area of tenderness identified to the right foot except for the PIPJ laterally on the right fourth digit. There is localized erythema from where it rubs inside the shoes. There is no longer any corn or hyperkeratotic formation into this area but area is still tender. There is no open sores identified. No open lesions or pre-ulcerative lesions.  No pain with calf compression, swelling, warmth, erythema  Assessment: Bony exostosis right fourth proximal phalanx  Plan: -All treatment options discussed with the patient including all alternatives, risks, complications.  -At this time given her continued pain she is requesting further surgical intervention help decrease the bony prominence. I discussed her exostectomy, removal of further bone from the PIPJ for an arthroplasty of the digit. She understands the risks and complications of the surgery that this is not a guarantee resolution of symptoms and she wishes to proceed. We will plan on doing this in the office next week under local anesthesia only. -The incision placement as well as the  postoperative course was discussed with the patient. I discussed risks of the surgery which include, but not limited to, infection, bleeding, pain, swelling, need for further surgery, delayed or nonhealing, painful or ugly scar, numbness or sensation changes, over/under correction, recurrence, transfer lesions, further deformity, hardware failure, DVT/PE, loss of toe/foot. Patient understands these risks and wishes to proceed with surgery. The surgical consent was reviewed with the patient all 3 pages were signed. No promises or guarantees were given to the outcome of the procedure. All questions were answered to the best of my ability. Before the surgery the patient was encouraged to call the office if there is any further questions. The surgery will be performed in the office next week.  -Patient encouraged to call the office with any questions, concerns, change in symptoms.   Kelly Garcia, DPM

## 2017-08-30 NOTE — Telephone Encounter (Signed)
I left patient a message to be here on Wednesday at 10:30 am.  I asked her to call if she has any questions or concerns.

## 2017-09-04 ENCOUNTER — Encounter: Payer: Self-pay | Admitting: Podiatry

## 2017-09-04 ENCOUNTER — Ambulatory Visit (INDEPENDENT_AMBULATORY_CARE_PROVIDER_SITE_OTHER): Payer: BLUE CROSS/BLUE SHIELD | Admitting: Podiatry

## 2017-09-04 ENCOUNTER — Ambulatory Visit: Payer: BLUE CROSS/BLUE SHIELD | Admitting: Podiatry

## 2017-09-04 ENCOUNTER — Ambulatory Visit (INDEPENDENT_AMBULATORY_CARE_PROVIDER_SITE_OTHER): Payer: BLUE CROSS/BLUE SHIELD

## 2017-09-04 VITALS — BP 126/78 | HR 89 | Resp 18

## 2017-09-04 DIAGNOSIS — M898X9 Other specified disorders of bone, unspecified site: Secondary | ICD-10-CM

## 2017-09-04 MED ORDER — CEPHALEXIN 500 MG PO CAPS: 500.0000 mg | ORAL_CAPSULE | Freq: Three times a day (TID) | ORAL | 0 refills | Status: DC

## 2017-09-04 MED ORDER — CLINDAMYCIN HCL 150 MG PO CAPS: 150.0000 mg | ORAL_CAPSULE | Freq: Three times a day (TID) | ORAL | 0 refills | Status: DC

## 2017-09-04 NOTE — Progress Notes (Addendum)
Surgeon: Celesta Gentile, DPM Assistants: none Pre-operative diagnosis: Right 4th toe exostosis Post-operative diagnosis: same Procedure: Right 4th toe exostectomy Pathology: Specimen: none Pertinent Intra-op findings: see below Anesthesia: Local block with 3 cc 1:1 mix of lidocaine and marcaine plain Hemostasis: PAT @250mm  Hg EBL: minimal Materials: 4-0 monocryl, 4-0 nylon Injectables: non Complications: none  Indications for surgery: Kelly Garcia presents the office today for surgical correction of the painful bony exostosis off the lateral aspect of the right fourth toe. She has tried offloading, padding without any significant improvement she is requesting surgical intervention. All alternatives, risks, complications were discussed with the patient detail. No promises or guarantees were given as to the outcome of the procedure and all questions were answered to best of my ability.   Procedure in detail: The patient was both verbally and visually identified by myself as well as the nursing staff. She was anesthetized with mixture of 3 mL of a one-to-one mixture of 2% lidocaine plain and 0.5% Marcaine plain. Once anesthetized was brought back to the surgical suite with a right foot was scrubbed, prepped, draped in normal sterile fashion. Right foot was exsanguinated and pneumatic ankle tourniquet was inflated to 250 mm Hg. A well-padded ankle tourniquet was applied prior to draping.   A vertical incision is made overlying the scar from the prior hammertoe surgery. Incision was made with a 15 was coupled to the epidermis the dermis dissection was then carried down to the center tendon. This was transected at the level of the PIPJ and reflected proximally. This exposed the prominent bony exostosis off of the lateral aspect of the possible phalanx. A sagittal bone saw was utilized to resect further bone off the proximal phalanx in the lateral aspect with smoothed with a rasp. After this there was no  further bony prominence palpable in the toe is sitting in a rectus position. The incision was irrigated with sterile saline. Monocryl was utilized to reapproximate the extensor tendon and the skin was then closed with nylon in a simple interrupted suture fashion. Betadine was. Over the incision followed by a dry sterile dressing. The tourniquet was released there was found to be an immediate capillary refill time to all the digits. She was found to tolerate procedure well any complications.  At the conclusion of the procedure the patient was awoken from anesthesia and found to have tolerated the procedure well any complications. There were transferred to PACU with vital signs stable and vascular status intact.  She was brought to x-ray for 3 views of the right foot were obtained. Status post further removal bone spur present fourth toe. No evidence of acute fracture.  Surgical shoe was dispensed today.  Celesta Gentile, DPM

## 2017-09-06 ENCOUNTER — Telehealth: Payer: Self-pay | Admitting: *Deleted

## 2017-09-06 NOTE — Telephone Encounter (Signed)
Called patient and left a message for the patient to call me back-just checking to see  how the patient was doing after surgery on Wednesday. Kelly Garcia

## 2017-09-12 ENCOUNTER — Ambulatory Visit: Payer: BLUE CROSS/BLUE SHIELD

## 2017-09-12 ENCOUNTER — Encounter: Payer: BLUE CROSS/BLUE SHIELD | Admitting: Podiatry

## 2017-09-12 DIAGNOSIS — M2041 Other hammer toe(s) (acquired), right foot: Secondary | ICD-10-CM

## 2017-09-12 NOTE — Progress Notes (Signed)
This encounter was created in error - please disregard.

## 2017-09-19 ENCOUNTER — Ambulatory Visit (INDEPENDENT_AMBULATORY_CARE_PROVIDER_SITE_OTHER): Payer: BLUE CROSS/BLUE SHIELD | Admitting: Podiatry

## 2017-09-19 ENCOUNTER — Encounter: Payer: Self-pay | Admitting: Podiatry

## 2017-09-19 ENCOUNTER — Encounter: Payer: BLUE CROSS/BLUE SHIELD | Admitting: Podiatry

## 2017-09-19 ENCOUNTER — Ambulatory Visit: Payer: BLUE CROSS/BLUE SHIELD | Admitting: Podiatry

## 2017-09-19 DIAGNOSIS — Z9889 Other specified postprocedural states: Secondary | ICD-10-CM

## 2017-09-19 DIAGNOSIS — M898X9 Other specified disorders of bone, unspecified site: Secondary | ICD-10-CM

## 2017-09-20 NOTE — Progress Notes (Signed)
Subjective: Kelly Garcia is a 51 y.o. is seen today in office s/p right 4th exostectomy preformed on 09/04/2017. They state their pain is better controlled now but she was having quite a bit of pain after the surgery initally. She is continuing to a surgical shoe. She has no concerns. Denies any systemic complaints such as fevers, chills, nausea, vomiting. No calf pain, chest pain, shortness of breath.   Objective: General: No acute distress, AAOx3  DP/PT pulses palpable 2/4, CRT < 3 sec to all digits.  Protective sensation intact. Motor function intact.  Right foot: Incision is well coapted without any evidence of dehiscence with sutures intact. There is no surrounding erythema, ascending cellulitis, fluctuance, crepitus, malodor, drainage/purulence. There is mild edema around the surgical site. There is mild pain along the surgical site. Toe is in rectus position. The incision appears to be healing well.  No other areas of tenderness to bilateral lower extremities.  No other open lesions or pre-ulcerative lesions.  No pain with calf compression, swelling, warmth, erythema.   Assessment and Plan:  Status post right foot surgery, doing well with no complications   -Treatment options discussed including all alternatives, risks, and complications -Incision appears to be healing well. However she had delayed healing after her last surgery of the sutures intact today. Antibiotic ointment was applied followed by a bandage. She can change the bandage with this if needed. -Ice/elevation -Pain medication as needed. -Monitor for any clinical signs or symptoms of infection and DVT/PE and directed to call the office immediately should any occur or go to the ER. -Follow-up in 1 week for suture removal or sooner if any problems arise. In the meantime, encouraged to call the office with any questions, concerns, change in symptoms.   Celesta Gentile, DPM

## 2017-09-27 ENCOUNTER — Encounter: Payer: Self-pay | Admitting: Podiatry

## 2017-09-27 ENCOUNTER — Ambulatory Visit (INDEPENDENT_AMBULATORY_CARE_PROVIDER_SITE_OTHER): Payer: Self-pay | Admitting: Podiatry

## 2017-09-27 DIAGNOSIS — M898X9 Other specified disorders of bone, unspecified site: Secondary | ICD-10-CM

## 2017-09-27 DIAGNOSIS — Z9889 Other specified postprocedural states: Secondary | ICD-10-CM

## 2017-09-29 NOTE — Progress Notes (Signed)
Subjective: Kelly Garcia is a 51 y.o. is seen today in office s/p right 4th exostectomy preformed on 09/04/2017. She states that she is doing better. She remained in the surgical shoe. She presents today for suture removal. She has no new concerns today. Denies any systemic complaints such as fevers, chills, nausea, vomiting. No calf pain, chest pain, shortness of breath.   Objective: General: No acute distress, AAOx3  DP/PT pulses palpable 2/4, CRT < 3 sec to all digits.  Protective sensation intact. Motor function intact.  Right foot: Incision is well coapted without any evidence of dehiscence with sutures intact. There is no motion across the incision today. There is no surrounding erythema, ascending cellulitis, fluctuance, crepitus, malodor, drainage/purulence. There is mild, but improved, edema around the surgical site. There is decreased pain along the surgical site. Toe is in rectus position.  No other areas of tenderness to bilateral lower extremities.  No other open lesions or pre-ulcerative lesions.  No pain with calf compression, swelling, warmth, erythema.   Assessment and Plan:  Status post right foot surgery, doing well with no complications   -Treatment options discussed including all alternatives, risks, and complications -Sutures removed today without any complications. After they were removed the incision remained well coapted. Steri-Strips are applied for reinforcement followed by antibiotic ointment and a bandage. -I showed her how to tape the toe to help with swelling -Remaining surgical shoe for now. About 1 week she can return to regular shoe as she is able to. -Ice and elevation -Pain medication as needed but she has not been taking this. -Follow-up in 3 weeks as scheduled or sooner if any issues are to arise. Call any questions or concerns in the meantime.  Celesta Gentile, DPM

## 2017-10-07 ENCOUNTER — Other Ambulatory Visit: Payer: Self-pay | Admitting: Obstetrics and Gynecology

## 2017-10-07 DIAGNOSIS — Z1231 Encounter for screening mammogram for malignant neoplasm of breast: Secondary | ICD-10-CM

## 2017-10-18 ENCOUNTER — Ambulatory Visit (INDEPENDENT_AMBULATORY_CARE_PROVIDER_SITE_OTHER): Payer: BLUE CROSS/BLUE SHIELD | Admitting: Podiatry

## 2017-10-18 DIAGNOSIS — Z9889 Other specified postprocedural states: Secondary | ICD-10-CM

## 2017-10-18 DIAGNOSIS — M898X9 Other specified disorders of bone, unspecified site: Secondary | ICD-10-CM

## 2017-10-23 NOTE — Progress Notes (Signed)
Subjective: Kelly Garcia is a 51 y.o. is seen today in office s/p right 4th exostectomy preformed on 09/04/2017. She does that she continues were open toed sandal majority time due to the swelling to the fourth toe is still remaining wrist difficult to wear closed in tennis shoe. She gets max pain 5/10. It has been somewhat better but does continue. She denies any recent injury or trauma to the area. She has no other concerns today. Denies any systemic complaints such as fevers, chills, nausea, vomiting. No calf pain, chest pain, shortness of breath.   Objective: General: No acute distress, AAOx3  DP/PT pulses palpable 2/4, CRT < 3 sec to all digits.  Protective sensation intact. Motor function intact.  Right foot: Incision is well coapted without any evidence of dehiscence and a scar has formed. Toe appears to be in rectus position. There is still mild swelling to the tip and overall exit. She improved compared to what it was. There is no erythema, ascending cellulitis or drainage or any clinical signs of infection present. Mild to palpation the surgical site. No other areas of tenderness identified at this time. No other areas of tenderness to bilateral lower extremities.  No other open lesions or pre-ulcerative lesions.  No pain with calf compression, swelling, warmth, erythema.   Assessment and Plan:  Status post right foot surgery,  -Treatment options discussed including all alternatives, risks, and complications - At this point on her continue with compression to the toe to help with swelling although it appears to be improved. She can return to regular she was able. She's had 2 surgeries this toe within the last year shows can take some time for the swelling to resolve.Continue ice and elevation. Pain medication as needed but she's not been taking this for the foot. She appears to be uncomfortable today but this is due to kidney, bladder issues which are chronic. Monitor for any clinical  signs or symptoms of infection and directed to call the office immediately should any occur or go to the ER. She has no other concerns or questions today.  Celesta Gentile, DPM

## 2017-11-12 ENCOUNTER — Ambulatory Visit: Payer: BLUE CROSS/BLUE SHIELD

## 2017-11-29 ENCOUNTER — Ambulatory Visit: Payer: BLUE CROSS/BLUE SHIELD | Admitting: Podiatry

## 2017-12-12 ENCOUNTER — Ambulatory Visit: Payer: BLUE CROSS/BLUE SHIELD

## 2017-12-24 ENCOUNTER — Ambulatory Visit: Payer: BLUE CROSS/BLUE SHIELD | Admitting: Podiatry

## 2017-12-30 ENCOUNTER — Ambulatory Visit: Payer: BLUE CROSS/BLUE SHIELD | Admitting: Podiatry

## 2018-01-06 ENCOUNTER — Ambulatory Visit: Payer: BLUE CROSS/BLUE SHIELD

## 2018-02-06 ENCOUNTER — Ambulatory Visit: Payer: BLUE CROSS/BLUE SHIELD

## 2018-02-11 ENCOUNTER — Encounter: Payer: Self-pay | Admitting: Internal Medicine

## 2018-02-11 ENCOUNTER — Ambulatory Visit (INDEPENDENT_AMBULATORY_CARE_PROVIDER_SITE_OTHER): Payer: BLUE CROSS/BLUE SHIELD | Admitting: Internal Medicine

## 2018-02-11 DIAGNOSIS — E785 Hyperlipidemia, unspecified: Secondary | ICD-10-CM

## 2018-02-11 DIAGNOSIS — M069 Rheumatoid arthritis, unspecified: Secondary | ICD-10-CM

## 2018-02-11 DIAGNOSIS — Z86718 Personal history of other venous thrombosis and embolism: Secondary | ICD-10-CM | POA: Diagnosis not present

## 2018-02-11 DIAGNOSIS — K121 Other forms of stomatitis: Secondary | ICD-10-CM | POA: Diagnosis not present

## 2018-02-11 DIAGNOSIS — K760 Fatty (change of) liver, not elsewhere classified: Secondary | ICD-10-CM

## 2018-02-11 DIAGNOSIS — N3010 Interstitial cystitis (chronic) without hematuria: Secondary | ICD-10-CM

## 2018-02-11 DIAGNOSIS — F419 Anxiety disorder, unspecified: Secondary | ICD-10-CM

## 2018-02-11 DIAGNOSIS — Q6210 Congenital occlusion of ureter, unspecified: Secondary | ICD-10-CM

## 2018-02-11 NOTE — Assessment & Plan Note (Signed)
She has chronic mouth pain with occasional flares and describes blistering and peeling inside her mouth.  This does not sound like thrush.  Oral candidiasis usually does not cause much discomfort at all and does not lead to any peeling inside the mouth.  I am not sure what is causing her chronic stomatitis.  I told her that she does not need to brush her tongue on a daily basis.  I suggested that she follow-up with her dentist to talk to him about what might be causing her more chronic symptoms.  It is certainly possible that she has had episodic candidiasis in the past.  However she does not improve with antifungal therapy.  I suggested that she talk to her multiple providers about possibly setting a higher threshold for starting empiric antibiotics in the future.  If she does develop acute exacerbations of her mouth and throat pain I would suggest that any lesions suspicious for candidiasis be cultured.  If Candida is grown I would request that the laboratory obtain antifungal susceptibilities to see if she harbors fluconazole resistant strains.  If there is any blistering or ulcerative lesions I would culture for herpes.  I would be happy to see her back at any time in the future.  I see no reason why she could not safely restart methotrexate.

## 2018-02-11 NOTE — Progress Notes (Signed)
[facility]  Reason for Consult: Recurrent candidiasis Referring Physician: Dr. Leta Baptist  Assessment: She has chronic mouth pain with occasional flares and describes blistering and peeling inside her mouth.  This does not sound like thrush.  Oral candidiasis usually does not cause much discomfort at all and does not lead to any peeling inside the mouth.  I am not sure what is causing her chronic stomatitis.  I told her that she does not need to brush her tongue on a daily basis.  I suggested that she follow-up with her dentist to talk to him about what might be causing her more chronic symptoms.  It is certainly possible that she has had episodic candidiasis in the past.  However she does not improve with antifungal therapy.  I suggested that she talk to her multiple providers about possibly setting a higher threshold for starting empiric antibiotics in the future.  If she does develop acute exacerbations of her mouth and throat pain I would suggest that any lesions suspicious for candidiasis be cultured.  If Candida is grown I would request that the laboratory obtain antifungal susceptibilities to see if she harbors fluconazole resistant strains.  If there is any blistering or ulcerative lesions I would culture for herpes.  I would be happy to see her back at any time in the future.  I see no reason why she could not safely restart methotrexate.   Plan: 1. No need for antifungal therapy at this time 2. Follow-up here as needed  Patient Active Problem List   Diagnosis Date Noted  . Stomatitis 02/11/2018    Priority: High  . Interstitial cystitis 02/11/2018  . Rheumatoid arthritis ([place]) 02/11/2018  . History of DVT (deep vein thrombosis) 02/11/2018  . Dyslipidemia 02/11/2018  . NAFLD (nonalcoholic fatty liver disease) 02/11/2018  . Anxiety 02/11/2018  . Ureteral stenosis 02/11/2018  . Injury of plantar plate of right foot 32/35/5732  . Neuroma  01/17/2017    Patient's Medications  New Prescriptions   No medications on file  Previous Medications   BIOTIN 1 MG CAPS    Take by mouth.   CALCIUM CARBONATE 200 MG CAPSULE    Take 250 mg by mouth 2 (two) times daily with a meal.   CLINDAMYCIN (CLEOCIN) 150 MG CAPSULE    Take 1 mg by mouth 3 (three) times daily.   CLINDAMYCIN (CLEOCIN) 150 MG CAPSULE    Take 1 capsule (150 mg total) by mouth 3 (three) times daily.   ENOXAPARIN (LOVENOX) 40 MG/0.4ML INJECTION    Inject 0.4 mLs (40 mg total) into the skin daily.   ESOMEPRAZOLE (NEXIUM) 20 MG CAPSULE    Take 20 mg by mouth daily at 12 noon.   HYDROXYZINE (ATARAX/VISTARIL) 25 MG TABLET    Take 25 mg by mouth 3 (three) times daily as needed.   MULTIPLE VITAMIN (MULTIVITAMIN) TABLET    Take 1 tablet by mouth daily.   OMEGA-3 FATTY ACIDS (FISH OIL) 1000 MG CAPS    Take by mouth.   OXYCODONE-ACETAMINOPHEN (PERCOCET/ROXICET) 5-325 MG TABLET    Take 1-2 tablets by mouth every 4 (four) hours as needed for severe pain.   OXYCODONE-ACETAMINOPHEN (ROXICET) 5-325 MG TABLET    Take 1 tablet by mouth every 6 (six) hours as needed for severe pain.   PHENAZOPYRIDINE (PYRIDIUM) 100 MG TABLET    Take 100 mg by mouth 3 (three) times daily as needed.   PROMETHAZINE (PHENERGAN)  25 MG TABLET    Take 1 mg by mouth every 8 (eight) hours as needed for nausea or vomiting.   RED YEAST RICE EXTRACT (RED YEAST RICE PO)    Take by mouth.   VITAMIN E 100 UNIT CAPSULE    Take by mouth daily.  Modified Medications   No medications on file  Discontinued Medications   No medications on file    HPI: Kelly Garcia is a 52 y.o. female who is referred for evaluation of recurrent candidiasis.  She states that this has been a problem for her for "years".  When I asked her to be more specific she indicated that it might have been a problem for 10-20 years.  She says that when the problem first began she was bothered by recurrent vaginal candidiasis.  In recent year she says that  she has had vaginal candidiasis, perirectal candidiasis and recurrent thrush.  She has a history of rheumatoid arthritis, interstitial cystitis and nonalcoholic fatty liver disease.  Her rheumatoid arthritis has been managed with methotrexate and occasional pulse doses of steroids.  She has not been on any steroids recently.  Her methotrexate was stopped 8 weeks ago because of concerns about recurrent thrush.  She has been evaluated by many different providers for this problem including her PCP, other providers at [facility], her urologist, her gastroenterologist, her ear nose and throat doctor, her rheumatologist, her gynecologist, her dentist, and an oral surgeon.  She tells me that she is also had opinions from doctors at [facility].  She says that when she has episodes of thrush she will have pain inside her mouth and sometimes her throat.  The pain will be on her tongue and inside her cheeks and throat.  She will usually get blistering and peeling inside her mouth.  Even after the episodes subside she is left with burning pain throughout her mouth that does not get better.  She says that she has undergone upper endoscopy on 2 occasions recently which did not reveal any esophageal lesions.  Dr. Benjamine Mola also recently did direct laryngoscopy and mentioned only "several white patches on the base of the tongue".  She brushes her teeth and flosses every day.  She usually will also brush her tongue.  She uses a special toothpaste and children's mouthwash to be less irritating.  She has been on multiple courses of fluconazole, usually 150 mg daily but recently 200 mg daily without much benefit.  She has also been treated with Mycelex troches, nystatin swish and swallow and Magic mouthwash with and without lidocaine.  She does not seem to get much benefit from these either.  She says that exacerbations of her mouth pain frequently occur after being on antibiotics.  She says that she has been on multiple  rounds of antibiotics over the past several years.  She says that she has had 51 surgeries in the past 20 years including urologic procedures, sinus procedures, orthopedic procedures, hysterectomy and C-sections.  She is usually given antibiotics before her procedures.  She says that she had a severe "virus" infection around Thanksgiving and was treated with IM ceftriaxone and levofloxacin.  She says that she had the "flu" around Christmas and was treated with ceftriaxone and some other oral antibiotic again.  She had a ureteral stent placed in January.  She had some pain postoperatively and was treated with ciprofloxacin starting on 01/16/2018.  Her urine culture was negative at that time.  She has not been  on any other immunosuppressive medications.  She says that she has had upper respiratory infections on a fairly frequent basis and had pneumonia 2 years ago.  She believes she tested negative for HIV when she was pregnant with her children but they are currently 81 and 35 years old respectively.  She does not believe that she has any risk factors for HIV infection.  She has not been losing weight.  Review of Systems: Review of Systems  Constitutional: Negative for chills, diaphoresis, fever, malaise/fatigue and weight loss.  HENT: Negative for sore throat.        Noted in HPI.  Respiratory: Negative for cough, sputum production and shortness of breath.   Cardiovascular: Negative for chest pain.  Gastrointestinal: Negative for abdominal pain, diarrhea, heartburn, nausea and vomiting.  Genitourinary: Positive for frequency. Negative for dysuria.  Musculoskeletal: Positive for joint pain. Negative for myalgias.  Skin: Negative for rash.  Neurological: Negative for dizziness and headaches.      Past Medical History:  Diagnosis Date  . Anxiety   . Arthritis   . Asthma    controlled  . Carpal tunnel syndrome   . Chronic pelvic pain in female   . Complication of anesthesia    n/v-wants scope  patch  . Interstitial cystitis   . Multiple allergies   . PONV (postoperative nausea and vomiting)     Social History   Tobacco Use  . Smoking status: Never Smoker  . Smokeless tobacco: Never Used  Substance Use Topics  . Alcohol use: Yes    Comment: occ  . Drug use: No    No family history on file. Allergies  Allergen Reactions  . Benzoin     blister  . Cephalexin   . Codeine Nausea And Vomiting  . Doxycycline Itching  . Morphine And Related Itching  . Sulfa Antibiotics Hives    OBJECTIVE: Vitals:   02/11/18 0945  BP: 135/88  Pulse: (!) 105  Temp: 98.8 F (37.1 C)  TempSrc: Oral  Weight: 129 lb (58.5 kg)   Body mass index is 23.59 kg/m.   Physical Exam  Constitutional: She is oriented to person, place, and time.  She is pleasant, talkative and in no distress.  HENT:  Mouth/Throat: No oropharyngeal exudate.  She has normal pink, moist mucosa.  She has no oropharyngeal lesions visible.  Her teeth are in excellent condition.  Eyes: Conjunctivae are normal.  Cardiovascular: Normal rate and regular rhythm.  No murmur heard. Pulmonary/Chest: Effort normal and breath sounds normal. She has no wheezes. She has no rales.  Abdominal: Soft. She exhibits no mass. There is no tenderness.  Musculoskeletal: Normal range of motion.  Lymphadenopathy:    She has no cervical adenopathy.    She has no axillary adenopathy.  Neurological: She is alert and oriented to person, place, and time.  Skin: No rash noted.  Psychiatric: Mood and affect normal.    Microbiology: No results found for this or any previous visit (from the past 240 hour(s)).  Michel Bickers, MD [facility] [phone] pager   [phone] cell 02/11/2018, 12:38 PM

## 2018-02-17 ENCOUNTER — Ambulatory Visit: Payer: BLUE CROSS/BLUE SHIELD | Admitting: Podiatry

## 2018-02-28 ENCOUNTER — Inpatient Hospital Stay: Admission: RE | Admit: 2018-02-28 | Payer: BLUE CROSS/BLUE SHIELD | Source: Ambulatory Visit

## 2018-03-04 ENCOUNTER — Ambulatory Visit: Payer: BLUE CROSS/BLUE SHIELD | Admitting: Podiatry

## 2018-03-26 ENCOUNTER — Other Ambulatory Visit: Payer: Self-pay | Admitting: Obstetrics and Gynecology

## 2018-03-26 DIAGNOSIS — Z1231 Encounter for screening mammogram for malignant neoplasm of breast: Secondary | ICD-10-CM

## 2018-04-18 ENCOUNTER — Ambulatory Visit
Admission: RE | Admit: 2018-04-18 | Discharge: 2018-04-18 | Disposition: A | Discharge status: Home / Self Care | Payer: BLUE CROSS/BLUE SHIELD | Source: Ambulatory Visit | Attending: Obstetrics and Gynecology | Admitting: Obstetrics and Gynecology

## 2018-04-18 DIAGNOSIS — Z1231 Encounter for screening mammogram for malignant neoplasm of breast: Secondary | ICD-10-CM

## 2018-04-30 DIAGNOSIS — R682 Dry mouth, unspecified: Secondary | ICD-10-CM

## 2018-04-30 DIAGNOSIS — K117 Disturbances of salivary secretion: Secondary | ICD-10-CM | POA: Insufficient documentation

## 2018-07-25 DIAGNOSIS — M0609 Rheumatoid arthritis without rheumatoid factor, multiple sites: Secondary | ICD-10-CM | POA: Diagnosis not present

## 2018-07-28 DIAGNOSIS — R69 Illness, unspecified: Secondary | ICD-10-CM | POA: Diagnosis not present

## 2018-08-04 DIAGNOSIS — L255 Unspecified contact dermatitis due to plants, except food: Secondary | ICD-10-CM | POA: Diagnosis not present

## 2018-08-11 DIAGNOSIS — M797 Fibromyalgia: Secondary | ICD-10-CM | POA: Diagnosis not present

## 2018-08-11 DIAGNOSIS — M150 Primary generalized (osteo)arthritis: Secondary | ICD-10-CM | POA: Diagnosis not present

## 2018-08-11 DIAGNOSIS — Z79899 Other long term (current) drug therapy: Secondary | ICD-10-CM | POA: Diagnosis not present

## 2018-08-11 DIAGNOSIS — Z6824 Body mass index (BMI) 24.0-24.9, adult: Secondary | ICD-10-CM | POA: Diagnosis not present

## 2018-08-11 DIAGNOSIS — L237 Allergic contact dermatitis due to plants, except food: Secondary | ICD-10-CM | POA: Diagnosis not present

## 2018-08-11 DIAGNOSIS — M2550 Pain in unspecified joint: Secondary | ICD-10-CM | POA: Diagnosis not present

## 2018-08-11 DIAGNOSIS — M0609 Rheumatoid arthritis without rheumatoid factor, multiple sites: Secondary | ICD-10-CM | POA: Diagnosis not present

## 2018-08-12 DIAGNOSIS — Z658 Other specified problems related to psychosocial circumstances: Secondary | ICD-10-CM | POA: Diagnosis not present

## 2018-08-12 DIAGNOSIS — R69 Illness, unspecified: Secondary | ICD-10-CM | POA: Diagnosis not present

## 2018-08-12 DIAGNOSIS — N3010 Interstitial cystitis (chronic) without hematuria: Secondary | ICD-10-CM | POA: Diagnosis not present

## 2018-08-12 DIAGNOSIS — G4700 Insomnia, unspecified: Secondary | ICD-10-CM | POA: Diagnosis not present

## 2018-08-12 DIAGNOSIS — M797 Fibromyalgia: Secondary | ICD-10-CM | POA: Diagnosis not present

## 2018-08-12 DIAGNOSIS — F411 Generalized anxiety disorder: Secondary | ICD-10-CM | POA: Diagnosis not present

## 2018-08-12 DIAGNOSIS — K580 Irritable bowel syndrome with diarrhea: Secondary | ICD-10-CM | POA: Diagnosis not present

## 2018-08-14 DIAGNOSIS — R1013 Epigastric pain: Secondary | ICD-10-CM | POA: Diagnosis not present

## 2018-08-14 DIAGNOSIS — K30 Functional dyspepsia: Secondary | ICD-10-CM | POA: Diagnosis not present

## 2018-08-14 DIAGNOSIS — L237 Allergic contact dermatitis due to plants, except food: Secondary | ICD-10-CM | POA: Diagnosis not present

## 2018-08-14 DIAGNOSIS — B370 Candidal stomatitis: Secondary | ICD-10-CM | POA: Diagnosis not present

## 2018-08-15 DIAGNOSIS — R1013 Epigastric pain: Secondary | ICD-10-CM | POA: Diagnosis not present

## 2018-09-04 DIAGNOSIS — M069 Rheumatoid arthritis, unspecified: Secondary | ICD-10-CM | POA: Diagnosis not present

## 2018-09-04 DIAGNOSIS — B379 Candidiasis, unspecified: Secondary | ICD-10-CM | POA: Diagnosis not present

## 2018-09-04 DIAGNOSIS — J302 Other seasonal allergic rhinitis: Secondary | ICD-10-CM | POA: Diagnosis not present

## 2018-09-04 DIAGNOSIS — I10 Essential (primary) hypertension: Secondary | ICD-10-CM | POA: Diagnosis not present

## 2018-09-04 DIAGNOSIS — E039 Hypothyroidism, unspecified: Secondary | ICD-10-CM | POA: Diagnosis not present

## 2018-09-04 DIAGNOSIS — G8929 Other chronic pain: Secondary | ICD-10-CM | POA: Diagnosis not present

## 2018-09-04 DIAGNOSIS — G4700 Insomnia, unspecified: Secondary | ICD-10-CM | POA: Diagnosis not present

## 2018-09-04 DIAGNOSIS — R69 Illness, unspecified: Secondary | ICD-10-CM | POA: Diagnosis not present

## 2018-09-08 DIAGNOSIS — K760 Fatty (change of) liver, not elsewhere classified: Secondary | ICD-10-CM | POA: Diagnosis not present

## 2018-09-08 DIAGNOSIS — Z0100 Encounter for examination of eyes and vision without abnormal findings: Secondary | ICD-10-CM | POA: Diagnosis not present

## 2018-09-08 DIAGNOSIS — H52221 Regular astigmatism, right eye: Secondary | ICD-10-CM | POA: Diagnosis not present

## 2018-09-08 DIAGNOSIS — H5203 Hypermetropia, bilateral: Secondary | ICD-10-CM | POA: Diagnosis not present

## 2018-09-08 DIAGNOSIS — H524 Presbyopia: Secondary | ICD-10-CM | POA: Diagnosis not present

## 2018-09-08 DIAGNOSIS — R1013 Epigastric pain: Secondary | ICD-10-CM | POA: Diagnosis not present

## 2018-09-08 DIAGNOSIS — K117 Disturbances of salivary secretion: Secondary | ICD-10-CM | POA: Diagnosis not present

## 2018-09-16 DIAGNOSIS — M2550 Pain in unspecified joint: Secondary | ICD-10-CM | POA: Diagnosis not present

## 2018-09-16 DIAGNOSIS — Z79899 Other long term (current) drug therapy: Secondary | ICD-10-CM | POA: Diagnosis not present

## 2018-09-16 DIAGNOSIS — M150 Primary generalized (osteo)arthritis: Secondary | ICD-10-CM | POA: Diagnosis not present

## 2018-09-16 DIAGNOSIS — M797 Fibromyalgia: Secondary | ICD-10-CM | POA: Diagnosis not present

## 2018-09-16 DIAGNOSIS — M0609 Rheumatoid arthritis without rheumatoid factor, multiple sites: Secondary | ICD-10-CM | POA: Diagnosis not present

## 2018-09-16 DIAGNOSIS — Z6824 Body mass index (BMI) 24.0-24.9, adult: Secondary | ICD-10-CM | POA: Diagnosis not present

## 2018-09-18 DIAGNOSIS — R109 Unspecified abdominal pain: Secondary | ICD-10-CM | POA: Diagnosis not present

## 2018-09-18 DIAGNOSIS — N3010 Interstitial cystitis (chronic) without hematuria: Secondary | ICD-10-CM | POA: Diagnosis not present

## 2018-09-18 DIAGNOSIS — N3941 Urge incontinence: Secondary | ICD-10-CM | POA: Diagnosis not present

## 2018-09-29 DIAGNOSIS — M7918 Myalgia, other site: Secondary | ICD-10-CM | POA: Diagnosis not present

## 2018-09-29 DIAGNOSIS — N3010 Interstitial cystitis (chronic) without hematuria: Secondary | ICD-10-CM | POA: Diagnosis not present

## 2018-09-29 DIAGNOSIS — M791 Myalgia, unspecified site: Secondary | ICD-10-CM | POA: Diagnosis not present

## 2018-09-29 DIAGNOSIS — R3915 Urgency of urination: Secondary | ICD-10-CM | POA: Diagnosis not present

## 2018-09-29 DIAGNOSIS — R350 Frequency of micturition: Secondary | ICD-10-CM | POA: Diagnosis not present

## 2018-09-29 DIAGNOSIS — Z79899 Other long term (current) drug therapy: Secondary | ICD-10-CM | POA: Diagnosis not present

## 2018-09-29 DIAGNOSIS — M069 Rheumatoid arthritis, unspecified: Secondary | ICD-10-CM | POA: Diagnosis not present

## 2018-10-28 DIAGNOSIS — R69 Illness, unspecified: Secondary | ICD-10-CM | POA: Diagnosis not present

## 2018-11-12 DIAGNOSIS — Z79899 Other long term (current) drug therapy: Secondary | ICD-10-CM | POA: Diagnosis not present

## 2018-11-12 DIAGNOSIS — Z6825 Body mass index (BMI) 25.0-25.9, adult: Secondary | ICD-10-CM | POA: Diagnosis not present

## 2018-11-12 DIAGNOSIS — M0609 Rheumatoid arthritis without rheumatoid factor, multiple sites: Secondary | ICD-10-CM | POA: Diagnosis not present

## 2018-11-12 DIAGNOSIS — M2550 Pain in unspecified joint: Secondary | ICD-10-CM | POA: Diagnosis not present

## 2018-11-12 DIAGNOSIS — M150 Primary generalized (osteo)arthritis: Secondary | ICD-10-CM | POA: Diagnosis not present

## 2018-11-12 DIAGNOSIS — M797 Fibromyalgia: Secondary | ICD-10-CM | POA: Diagnosis not present

## 2018-11-12 DIAGNOSIS — E663 Overweight: Secondary | ICD-10-CM | POA: Diagnosis not present

## 2018-11-20 DIAGNOSIS — N3941 Urge incontinence: Secondary | ICD-10-CM | POA: Diagnosis not present

## 2018-11-20 DIAGNOSIS — N3010 Interstitial cystitis (chronic) without hematuria: Secondary | ICD-10-CM | POA: Diagnosis not present

## 2018-11-20 DIAGNOSIS — K589 Irritable bowel syndrome without diarrhea: Secondary | ICD-10-CM | POA: Diagnosis not present

## 2018-12-01 DIAGNOSIS — R69 Illness, unspecified: Secondary | ICD-10-CM | POA: Diagnosis not present

## 2018-12-18 ENCOUNTER — Ambulatory Visit: Payer: BLUE CROSS/BLUE SHIELD | Admitting: Podiatry

## 2018-12-23 ENCOUNTER — Ambulatory Visit: Payer: Medicare HMO | Admitting: Podiatry

## 2018-12-24 DIAGNOSIS — E7849 Other hyperlipidemia: Secondary | ICD-10-CM | POA: Diagnosis not present

## 2018-12-24 DIAGNOSIS — Z0000 Encounter for general adult medical examination without abnormal findings: Secondary | ICD-10-CM | POA: Diagnosis not present

## 2018-12-24 DIAGNOSIS — I10 Essential (primary) hypertension: Secondary | ICD-10-CM | POA: Diagnosis not present

## 2018-12-29 ENCOUNTER — Other Ambulatory Visit: Payer: Self-pay | Admitting: Obstetrics and Gynecology

## 2018-12-29 DIAGNOSIS — Z1231 Encounter for screening mammogram for malignant neoplasm of breast: Secondary | ICD-10-CM

## 2018-12-30 DIAGNOSIS — M1990 Unspecified osteoarthritis, unspecified site: Secondary | ICD-10-CM | POA: Diagnosis not present

## 2018-12-30 DIAGNOSIS — R600 Localized edema: Secondary | ICD-10-CM | POA: Diagnosis not present

## 2018-12-30 DIAGNOSIS — M797 Fibromyalgia: Secondary | ICD-10-CM | POA: Diagnosis not present

## 2018-12-30 DIAGNOSIS — Z23 Encounter for immunization: Secondary | ICD-10-CM | POA: Diagnosis not present

## 2018-12-30 DIAGNOSIS — R5382 Chronic fatigue, unspecified: Secondary | ICD-10-CM | POA: Diagnosis not present

## 2018-12-30 DIAGNOSIS — D8989 Other specified disorders involving the immune mechanism, not elsewhere classified: Secondary | ICD-10-CM | POA: Diagnosis not present

## 2018-12-30 DIAGNOSIS — E559 Vitamin D deficiency, unspecified: Secondary | ICD-10-CM | POA: Diagnosis not present

## 2018-12-30 DIAGNOSIS — G894 Chronic pain syndrome: Secondary | ICD-10-CM | POA: Diagnosis not present

## 2018-12-30 DIAGNOSIS — R69 Illness, unspecified: Secondary | ICD-10-CM | POA: Diagnosis not present

## 2018-12-30 DIAGNOSIS — M069 Rheumatoid arthritis, unspecified: Secondary | ICD-10-CM | POA: Diagnosis not present

## 2018-12-30 DIAGNOSIS — Z0000 Encounter for general adult medical examination without abnormal findings: Secondary | ICD-10-CM | POA: Diagnosis not present

## 2019-01-01 ENCOUNTER — Ambulatory Visit: Payer: Medicare HMO | Admitting: Podiatry

## 2019-01-06 ENCOUNTER — Ambulatory Visit: Payer: Medicare HMO | Admitting: Podiatry

## 2019-01-06 DIAGNOSIS — R69 Illness, unspecified: Secondary | ICD-10-CM | POA: Diagnosis not present

## 2019-01-13 ENCOUNTER — Ambulatory Visit: Payer: Medicare HMO | Admitting: Podiatry

## 2019-01-20 DIAGNOSIS — R69 Illness, unspecified: Secondary | ICD-10-CM | POA: Diagnosis not present

## 2019-01-27 DIAGNOSIS — R69 Illness, unspecified: Secondary | ICD-10-CM | POA: Diagnosis not present

## 2019-02-02 ENCOUNTER — Encounter: Payer: Self-pay | Admitting: Podiatry

## 2019-02-02 ENCOUNTER — Ambulatory Visit (INDEPENDENT_AMBULATORY_CARE_PROVIDER_SITE_OTHER): Payer: Medicare HMO

## 2019-02-02 ENCOUNTER — Ambulatory Visit (INDEPENDENT_AMBULATORY_CARE_PROVIDER_SITE_OTHER): Payer: Medicare HMO | Admitting: Podiatry

## 2019-02-02 DIAGNOSIS — R69 Illness, unspecified: Secondary | ICD-10-CM | POA: Diagnosis not present

## 2019-02-02 DIAGNOSIS — M898X9 Other specified disorders of bone, unspecified site: Secondary | ICD-10-CM

## 2019-02-03 DIAGNOSIS — R300 Dysuria: Secondary | ICD-10-CM | POA: Diagnosis not present

## 2019-02-03 DIAGNOSIS — R350 Frequency of micturition: Secondary | ICD-10-CM | POA: Diagnosis not present

## 2019-02-03 DIAGNOSIS — R69 Illness, unspecified: Secondary | ICD-10-CM | POA: Diagnosis not present

## 2019-02-03 DIAGNOSIS — R5382 Chronic fatigue, unspecified: Secondary | ICD-10-CM | POA: Diagnosis not present

## 2019-02-03 DIAGNOSIS — R3911 Hesitancy of micturition: Secondary | ICD-10-CM | POA: Diagnosis not present

## 2019-02-03 DIAGNOSIS — N3010 Interstitial cystitis (chronic) without hematuria: Secondary | ICD-10-CM | POA: Diagnosis not present

## 2019-02-03 DIAGNOSIS — M797 Fibromyalgia: Secondary | ICD-10-CM | POA: Diagnosis not present

## 2019-02-03 DIAGNOSIS — K760 Fatty (change of) liver, not elsewhere classified: Secondary | ICD-10-CM | POA: Diagnosis not present

## 2019-02-10 NOTE — Progress Notes (Signed)
Subjective: 53 year old female presents the office today for concerns of some discomfort to the right fourth toe.  She says is intermittent.  She is not sure if a corn is come back but she notices a hard spot on the side of her toe, pointing the lateral aspect.  She keeps toe separators at times which helps some.  She denies any recent injury or any changes otherwise.  Denies any systemic complaints such as fevers, chills, nausea, vomiting. No acute changes since last appointment, and no other complaints at this time.   Objective: AAO x3, NAD DP/PT pulses palpable bilaterally, CRT less than 3 seconds Overall the toes and rectus position incisions from prior surgery are well-healed.  There is no hyperkeratotic lesion in the area of discomfort that she is experiencing is coming more from a bony prominence the lateral aspect of the fourth toe this is at the level of the PIPJ.  No open lesions or pre-ulcerative lesions.  No pain with calf compression, swelling, warmth, erythema  Assessment: Bony prominence right fourth toe.  Plan: -All treatment options discussed with the patient including all alternatives, risks, complications.  -X-rays were obtained reviewed.  Status post arthroplasty of the right fourth toe.  There is no evidence of acute fracture. -We discussed various options including both surgical as well as conservative.  I would continue with conservative care.  Dispensed various offloading pads to help take pressure off the fourth toe.  Discussed shoe changes. -Patient encouraged to call the office with any questions, concerns, change in symptoms.   Trula Slade DPM

## 2019-02-11 DIAGNOSIS — R69 Illness, unspecified: Secondary | ICD-10-CM | POA: Diagnosis not present

## 2019-02-18 DIAGNOSIS — M150 Primary generalized (osteo)arthritis: Secondary | ICD-10-CM | POA: Diagnosis not present

## 2019-02-18 DIAGNOSIS — E663 Overweight: Secondary | ICD-10-CM | POA: Diagnosis not present

## 2019-02-18 DIAGNOSIS — Z6826 Body mass index (BMI) 26.0-26.9, adult: Secondary | ICD-10-CM | POA: Diagnosis not present

## 2019-02-18 DIAGNOSIS — M2550 Pain in unspecified joint: Secondary | ICD-10-CM | POA: Diagnosis not present

## 2019-02-18 DIAGNOSIS — M0609 Rheumatoid arthritis without rheumatoid factor, multiple sites: Secondary | ICD-10-CM | POA: Diagnosis not present

## 2019-02-18 DIAGNOSIS — Z79899 Other long term (current) drug therapy: Secondary | ICD-10-CM | POA: Diagnosis not present

## 2019-02-18 DIAGNOSIS — M797 Fibromyalgia: Secondary | ICD-10-CM | POA: Diagnosis not present

## 2019-02-25 DIAGNOSIS — R69 Illness, unspecified: Secondary | ICD-10-CM | POA: Diagnosis not present

## 2019-02-26 DIAGNOSIS — F411 Generalized anxiety disorder: Secondary | ICD-10-CM | POA: Diagnosis not present

## 2019-02-26 DIAGNOSIS — G4700 Insomnia, unspecified: Secondary | ICD-10-CM | POA: Diagnosis not present

## 2019-02-26 DIAGNOSIS — N3010 Interstitial cystitis (chronic) without hematuria: Secondary | ICD-10-CM | POA: Diagnosis not present

## 2019-02-26 DIAGNOSIS — Z634 Disappearance and death of family member: Secondary | ICD-10-CM | POA: Diagnosis not present

## 2019-02-26 DIAGNOSIS — M797 Fibromyalgia: Secondary | ICD-10-CM | POA: Diagnosis not present

## 2019-02-26 DIAGNOSIS — R69 Illness, unspecified: Secondary | ICD-10-CM | POA: Diagnosis not present

## 2019-02-27 DIAGNOSIS — R69 Illness, unspecified: Secondary | ICD-10-CM | POA: Diagnosis not present

## 2019-03-16 DIAGNOSIS — R945 Abnormal results of liver function studies: Secondary | ICD-10-CM | POA: Diagnosis not present

## 2019-04-01 DIAGNOSIS — R69 Illness, unspecified: Secondary | ICD-10-CM | POA: Diagnosis not present

## 2019-04-06 DIAGNOSIS — B349 Viral infection, unspecified: Secondary | ICD-10-CM | POA: Diagnosis not present

## 2019-04-10 DIAGNOSIS — Z20818 Contact with and (suspected) exposure to other bacterial communicable diseases: Secondary | ICD-10-CM | POA: Diagnosis not present

## 2019-04-10 DIAGNOSIS — B349 Viral infection, unspecified: Secondary | ICD-10-CM | POA: Diagnosis not present

## 2019-04-10 DIAGNOSIS — R5383 Other fatigue: Secondary | ICD-10-CM | POA: Diagnosis not present

## 2019-04-10 DIAGNOSIS — I10 Essential (primary) hypertension: Secondary | ICD-10-CM | POA: Diagnosis not present

## 2019-04-10 DIAGNOSIS — R05 Cough: Secondary | ICD-10-CM | POA: Diagnosis not present

## 2019-04-20 ENCOUNTER — Ambulatory Visit: Payer: BLUE CROSS/BLUE SHIELD

## 2019-04-20 DIAGNOSIS — D649 Anemia, unspecified: Secondary | ICD-10-CM | POA: Diagnosis not present

## 2019-04-20 DIAGNOSIS — K2950 Unspecified chronic gastritis without bleeding: Secondary | ICD-10-CM | POA: Diagnosis not present

## 2019-04-20 DIAGNOSIS — K2960 Other gastritis without bleeding: Secondary | ICD-10-CM | POA: Diagnosis not present

## 2019-04-20 DIAGNOSIS — K259 Gastric ulcer, unspecified as acute or chronic, without hemorrhage or perforation: Secondary | ICD-10-CM | POA: Diagnosis not present

## 2019-04-20 DIAGNOSIS — K2930 Chronic superficial gastritis without bleeding: Secondary | ICD-10-CM | POA: Diagnosis not present

## 2019-04-20 DIAGNOSIS — R1013 Epigastric pain: Secondary | ICD-10-CM | POA: Diagnosis not present

## 2019-04-20 DIAGNOSIS — Z13810 Encounter for screening for upper gastrointestinal disorder: Secondary | ICD-10-CM | POA: Diagnosis not present

## 2019-04-20 DIAGNOSIS — K3189 Other diseases of stomach and duodenum: Secondary | ICD-10-CM | POA: Diagnosis not present

## 2019-05-21 DIAGNOSIS — M150 Primary generalized (osteo)arthritis: Secondary | ICD-10-CM | POA: Diagnosis not present

## 2019-05-21 DIAGNOSIS — M797 Fibromyalgia: Secondary | ICD-10-CM | POA: Diagnosis not present

## 2019-05-21 DIAGNOSIS — Z79899 Other long term (current) drug therapy: Secondary | ICD-10-CM | POA: Diagnosis not present

## 2019-05-21 DIAGNOSIS — M0609 Rheumatoid arthritis without rheumatoid factor, multiple sites: Secondary | ICD-10-CM | POA: Diagnosis not present

## 2019-05-21 DIAGNOSIS — M2550 Pain in unspecified joint: Secondary | ICD-10-CM | POA: Diagnosis not present

## 2019-05-27 DIAGNOSIS — F418 Other specified anxiety disorders: Secondary | ICD-10-CM | POA: Diagnosis not present

## 2019-05-27 DIAGNOSIS — M797 Fibromyalgia: Secondary | ICD-10-CM | POA: Diagnosis not present

## 2019-05-27 DIAGNOSIS — N3010 Interstitial cystitis (chronic) without hematuria: Secondary | ICD-10-CM | POA: Diagnosis not present

## 2019-05-27 DIAGNOSIS — R69 Illness, unspecified: Secondary | ICD-10-CM | POA: Diagnosis not present

## 2019-05-27 DIAGNOSIS — F411 Generalized anxiety disorder: Secondary | ICD-10-CM | POA: Diagnosis not present

## 2019-06-03 DIAGNOSIS — M0609 Rheumatoid arthritis without rheumatoid factor, multiple sites: Secondary | ICD-10-CM | POA: Diagnosis not present

## 2019-06-11 ENCOUNTER — Ambulatory Visit: Payer: BLUE CROSS/BLUE SHIELD

## 2019-06-30 DIAGNOSIS — N3010 Interstitial cystitis (chronic) without hematuria: Secondary | ICD-10-CM | POA: Diagnosis not present

## 2019-06-30 DIAGNOSIS — N3941 Urge incontinence: Secondary | ICD-10-CM | POA: Diagnosis not present

## 2019-07-03 DIAGNOSIS — D649 Anemia, unspecified: Secondary | ICD-10-CM | POA: Diagnosis not present

## 2019-07-03 DIAGNOSIS — R1013 Epigastric pain: Secondary | ICD-10-CM | POA: Diagnosis not present

## 2019-07-10 DIAGNOSIS — I10 Essential (primary) hypertension: Secondary | ICD-10-CM | POA: Diagnosis not present

## 2019-07-10 DIAGNOSIS — D649 Anemia, unspecified: Secondary | ICD-10-CM | POA: Diagnosis not present

## 2019-07-10 DIAGNOSIS — R682 Dry mouth, unspecified: Secondary | ICD-10-CM | POA: Diagnosis not present

## 2019-07-10 DIAGNOSIS — D8989 Other specified disorders involving the immune mechanism, not elsewhere classified: Secondary | ICD-10-CM | POA: Diagnosis not present

## 2019-07-10 DIAGNOSIS — G894 Chronic pain syndrome: Secondary | ICD-10-CM | POA: Diagnosis not present

## 2019-07-10 DIAGNOSIS — M069 Rheumatoid arthritis, unspecified: Secondary | ICD-10-CM | POA: Diagnosis not present

## 2019-07-10 DIAGNOSIS — R1013 Epigastric pain: Secondary | ICD-10-CM | POA: Diagnosis not present

## 2019-07-10 DIAGNOSIS — N3010 Interstitial cystitis (chronic) without hematuria: Secondary | ICD-10-CM | POA: Diagnosis not present

## 2019-07-10 DIAGNOSIS — Z1331 Encounter for screening for depression: Secondary | ICD-10-CM | POA: Diagnosis not present

## 2019-07-10 DIAGNOSIS — R110 Nausea: Secondary | ICD-10-CM | POA: Diagnosis not present

## 2019-07-13 ENCOUNTER — Other Ambulatory Visit (HOSPITAL_COMMUNITY): Payer: Self-pay | Admitting: Internal Medicine

## 2019-07-13 ENCOUNTER — Other Ambulatory Visit: Payer: Self-pay | Admitting: Internal Medicine

## 2019-07-13 DIAGNOSIS — R1013 Epigastric pain: Secondary | ICD-10-CM

## 2019-07-13 DIAGNOSIS — R110 Nausea: Secondary | ICD-10-CM

## 2019-07-16 DIAGNOSIS — D649 Anemia, unspecified: Secondary | ICD-10-CM | POA: Diagnosis not present

## 2019-07-21 ENCOUNTER — Ambulatory Visit: Payer: Medicare HMO

## 2019-07-21 ENCOUNTER — Ambulatory Visit (HOSPITAL_COMMUNITY)
Admission: RE | Admit: 2019-07-21 | Discharge: 2019-07-21 | Disposition: A | Discharge status: Home / Self Care | Payer: Medicare HMO | Source: Ambulatory Visit | Attending: Internal Medicine | Admitting: Internal Medicine

## 2019-07-21 ENCOUNTER — Other Ambulatory Visit: Payer: Self-pay

## 2019-07-21 DIAGNOSIS — R1013 Epigastric pain: Secondary | ICD-10-CM | POA: Diagnosis present

## 2019-07-21 DIAGNOSIS — R109 Unspecified abdominal pain: Secondary | ICD-10-CM | POA: Diagnosis not present

## 2019-07-21 DIAGNOSIS — R110 Nausea: Secondary | ICD-10-CM | POA: Diagnosis present

## 2019-07-21 MED ORDER — TECHNETIUM TC 99M MEBROFENIN IV KIT
5.2000 | PACK | Freq: Once | INTRAVENOUS | Status: AC | PRN
  Administered 2019-07-21: 5.2 via INTRAVENOUS

## 2019-07-27 ENCOUNTER — Telehealth: Payer: Self-pay | Admitting: Oncology

## 2019-07-27 NOTE — Telephone Encounter (Signed)
Received a new hem referral from Dr. Virgina Jock at [facility] for low iron. Pt has been cld and scheduled to see Dr. Alen Blew on 8/20 at 2:15pm. She's aware to arrive 15 minutes early.

## 2019-08-05 ENCOUNTER — Telehealth: Payer: Self-pay

## 2019-08-05 NOTE — Telephone Encounter (Signed)
Left vm for pt regarding prescreening questions for appointment on 8/20. ° °

## 2019-08-06 ENCOUNTER — Other Ambulatory Visit: Payer: Self-pay

## 2019-08-06 ENCOUNTER — Inpatient Hospital Stay: Payer: Medicare HMO | Attending: Oncology | Admitting: Oncology

## 2019-08-06 VITALS — BP 151/88 | HR 84 | Temp 98.9°F | Resp 20 | Ht 62.0 in | Wt 139.8 lb

## 2019-08-06 DIAGNOSIS — Z79899 Other long term (current) drug therapy: Secondary | ICD-10-CM | POA: Insufficient documentation

## 2019-08-06 DIAGNOSIS — D508 Other iron deficiency anemias: Secondary | ICD-10-CM | POA: Diagnosis not present

## 2019-08-06 DIAGNOSIS — F419 Anxiety disorder, unspecified: Secondary | ICD-10-CM | POA: Insufficient documentation

## 2019-08-06 DIAGNOSIS — D509 Iron deficiency anemia, unspecified: Secondary | ICD-10-CM

## 2019-08-06 DIAGNOSIS — M069 Rheumatoid arthritis, unspecified: Secondary | ICD-10-CM | POA: Insufficient documentation

## 2019-08-06 DIAGNOSIS — J45909 Unspecified asthma, uncomplicated: Secondary | ICD-10-CM | POA: Insufficient documentation

## 2019-08-06 DIAGNOSIS — Z90710 Acquired absence of both cervix and uterus: Secondary | ICD-10-CM | POA: Insufficient documentation

## 2019-08-06 DIAGNOSIS — Z7901 Long term (current) use of anticoagulants: Secondary | ICD-10-CM | POA: Insufficient documentation

## 2019-08-06 NOTE — Progress Notes (Signed)
Reason for the request:    Iron deficiency anemia  HPI: I was asked by Dr. Virgina Jock to evaluate Kelly Garcia for iron deficiency anemia.  She is 53 year old woman with history of rheumatoid arthritis, asthma and anxiety.  She has been experiencing increased fatigue for the last few months as well as problems with her stomach including dyspepsia and abdominal pain.  She had laboratory testing in July 2020 with iron studies showed iron level of 61 and ferritin of 8 and iron binding capacity 383.  Her electrolytes and kidney function were all normal including calcium and creatinine.  CBC obtained at that time had a white cell count of 6.4, hemoglobin of 10.7 MCV of 84 with RDW of 17.6.  Platelet count of 354.  She has been on oral iron in the past during her pregnancy which cause her a lot of stomach pain and dyspepsia and she is unable to tolerated at this time.  She denies any hematochezia, melena but does report dyspepsia.  She had a colonoscopy and endoscopy in the last 2 years without any source of bleeding.  Her Hemoccult testing has been negative.  He denies any shortness of breath or chest pain.  She does report fatigue and tiredness.  She does not report any headaches, blurry vision, syncope or seizures. Does not report any fevers, chills or sweats.  Does not report any cough, wheezing or hemoptysis.  Does not report any chest pain, palpitation, orthopnea or leg edema.  Does not report any nausea, vomiting or abdominal pain.  Does not report any constipation or diarrhea.  Does not report any skeletal complaints.    Does not report frequency, urgency or hematuria.  Does not report any skin rashes or lesions. Does not report any heat or cold intolerance.  Does not report any lymphadenopathy or petechiae.  Does not report any anxiety or depression.  Remaining review of systems is negative.    Past Medical History:  Diagnosis Date  . Anxiety   . Arthritis   . Asthma    controlled  . Carpal tunnel  syndrome   . Chronic pelvic pain in female   . Complication of anesthesia    n/v-wants scope patch  . Interstitial cystitis   . Multiple allergies   . PONV (postoperative nausea and vomiting)   :  Past Surgical History:  Procedure Laterality Date  . ABDOMINAL HYSTERECTOMY    . ANKLE ARTHROSCOPY     right  . BREAST REDUCTION SURGERY    . CARPAL TUNNEL RELEASE  04/15/2012   Procedure: CARPAL TUNNEL RELEASE;  Surgeon: Hessie Dibble, MD;  Location: [facility];  Service: Orthopedics;  Laterality: Right;  . CESAREAN SECTION    . CYSTOSCOPY W/ DILATION OF BLADDER     multiple times for IC  . CYSTOSCOPY W/ DILATION OF BLADDER     bladder instillation of medications  . CYSTOSCOPY WITH HYDRODISTENSION AND BIOPSY    . DIAGNOSTIC LAPAROSCOPY     exp lap-lysis adhesions  . KNEE ARTHROSCOPY     rightx6  . KNEE ARTHROSCOPY     leftx2  . REDUCTION MAMMAPLASTY    . TRIGGER FINGER RELEASE  10/28/2012   Procedure: RELEASE TRIGGER FINGER/A-1 PULLEY;  Surgeon: Hessie Dibble, MD;  Location: [facility];  Service: Orthopedics;  Laterality: Right;  :   Current Outpatient Medications:  .  Biotin 1 MG CAPS, Take by mouth., Disp: , Rfl:  .  calcium carbonate 200 MG capsule, Take  250 mg by mouth 2 (two) times daily with a meal., Disp: , Rfl:  .  clindamycin (CLEOCIN) 150 MG capsule, Take 1 mg by mouth 3 (three) times daily., Disp: , Rfl:  .  clindamycin (CLEOCIN) 150 MG capsule, Take 1 capsule (150 mg total) by mouth 3 (three) times daily. (Patient not taking: Reported on 02/11/2018), Disp: 21 capsule, Rfl: 0 .  enoxaparin (LOVENOX) 40 MG/0.4ML injection, Inject 0.4 mLs (40 mg total) into the skin daily. (Patient not taking: Reported on 02/11/2018), Disp: 14 Syringe, Rfl: 0 .  esomeprazole (NEXIUM) 20 MG capsule, Take 20 mg by mouth daily at 12 noon., Disp: , Rfl:  .  hydrOXYzine (ATARAX/VISTARIL) 25 MG tablet, Take 25 mg by mouth 3 (three) times daily as needed., Disp:  , Rfl:  .  Multiple Vitamin (MULTIVITAMIN) tablet, Take 1 tablet by mouth daily., Disp: , Rfl:  .  Omega-3 Fatty Acids (FISH OIL) 1000 MG CAPS, Take by mouth., Disp: , Rfl:  .  oxyCODONE-acetaminophen (PERCOCET/ROXICET) 5-325 MG tablet, Take 1-2 tablets by mouth every 4 (four) hours as needed for severe pain., Disp: , Rfl:  .  oxyCODONE-acetaminophen (ROXICET) 5-325 MG tablet, Take 1 tablet by mouth every 6 (six) hours as needed for severe pain., Disp: 20 tablet, Rfl: 0 .  phenazopyridine (PYRIDIUM) 100 MG tablet, Take 100 mg by mouth 3 (three) times daily as needed., Disp: , Rfl:  .  promethazine (PHENERGAN) 25 MG tablet, Take 1 mg by mouth every 8 (eight) hours as needed for nausea or vomiting., Disp: , Rfl:  .  Red Yeast Rice Extract (RED YEAST RICE PO), Take by mouth., Disp: , Rfl:  .  vitamin E 100 UNIT capsule, Take by mouth daily., Disp: , Rfl: :  Allergies  Allergen Reactions  . Benzoin     blister  . Cephalexin   . Codeine Nausea And Vomiting  . Doxycycline Itching  . Morphine And Related Itching  . Sulfa Antibiotics Hives  :  No family history on file.:  Social History   Socioeconomic History  . Marital status: Single    Spouse name: Not on file  . Number of children: Not on file  . Years of education: Not on file  . Highest education level: Not on file  Occupational History  . Not on file  Social Needs  . Financial resource strain: Not on file  . Food insecurity    Worry: Not on file    Inability: Not on file  . Transportation needs    Medical: Not on file    Non-medical: Not on file  Tobacco Use  . Smoking status: Never Smoker  . Smokeless tobacco: Never Used  Substance and Sexual Activity  . Alcohol use: Yes    Comment: occ  . Drug use: No  . Sexual activity: Not on file  Lifestyle  . Physical activity    Days per week: Not on file    Minutes per session: Not on file  . Stress: Not on file  Relationships  . Social Herbalist on phone: Not  on file    Gets together: Not on file    Attends religious service: Not on file    Active member of club or organization: Not on file    Attends meetings of clubs or organizations: Not on file    Relationship status: Not on file  . Intimate partner violence    Fear of current or ex partner: Not on file  Emotionally abused: Not on file    Physically abused: Not on file    Forced sexual activity: Not on file  Other Topics Concern  . Not on file  Social History Narrative  . Not on file  :  Pertinent items are noted in HPI.  Exam:  Blood pressure (!) 151/88, pulse 84, temperature 98.9 F (37.2 C), temperature source Oral, resp. rate 20, height 5\' 2"  (1.575 m), weight 139 lb 12.8 oz (63.4 kg), SpO2 99 %.   ECOG 1  General appearance: alert and cooperative appeared without distress. Head: atraumatic without any abnormalities. Eyes: conjunctivae/corneas clear. PERRL.  Sclera anicteric. Throat: lips, mucosa, and tongue normal; without oral thrush or ulcers. Resp: clear to auscultation bilaterally without rhonchi, wheezes or dullness to percussion. Cardio: regular rate and rhythm, S1, S2 normal, no murmur, click, rub or gallop GI: soft, non-tender; bowel sounds normal; no masses,  no organomegaly Skin: Skin color, texture, turgor normal. No rashes or lesions Lymph nodes: Cervical, supraclavicular, and axillary nodes normal. Neurologic: Grossly normal without any motor, sensory or deep tendon reflexes. Musculoskeletal: No joint deformity or effusion.  CBC    Component Value Date/Time   WBC 10.3 03/15/2008 1837   RBC 4.41 03/15/2008 1837   HGB 11.5 (L) 10/28/2012 0925   HCT 38.3 03/15/2008 1837   PLT 344 03/15/2008 1837   MCV 86.8 03/15/2008 1837   MCHC 35.4 03/15/2008 1837   RDW 13.5 03/15/2008 1837   LYMPHSABS 3.5 03/15/2008 1837   MONOABS 0.7 03/15/2008 1837   EOSABS 0.1 03/15/2008 1837   BASOSABS 0.0 03/15/2008 1837     Nm Hepato W/eject Fract  Result Date:  07/21/2019 CLINICAL DATA:  Abdominal pain with nausea EXAM: NUCLEAR MEDICINE HEPATOBILIARY IMAGING WITH GALLBLADDER EF TECHNIQUE: Sequential images of the abdomen were obtained out to 60 minutes following intravenous administration of radiopharmaceutical. After oral ingestion of Ensure, gallbladder ejection fraction was determined. At 60 min, normal ejection fraction is greater than 33%. RADIOPHARMACEUTICALS:  5.2 mCi Tc-74m  Choletec IV COMPARISON:  Ultrasound 07/12/2016 FINDINGS: Prompt uptake and biliary excretion of activity by the liver is seen. Gallbladder activity is visualized, consistent with patency of cystic duct. Biliary activity passes into small bowel, consistent with patent common bile duct. Calculated gallbladder ejection fraction is 88%. (Normal gallbladder ejection fraction with Ensure is greater than 33%.) IMPRESSION: Normal study Electronically Signed   By: Donavan Foil M.D.   On: 07/21/2019 16:17    Assessment and Plan:   53 year old with:  1.  Iron deficiency anemia documented in July 2020.  She presented with a hemoglobin of 10.7 and ferritin of 8.  The etiology of her iron deficiency mostly related to poor iron absorption rather than chronic blood losses.  Colonoscopy and endoscopy as well as Hemoccult testing has been negative.  Treatment options for her condition was discussed today.  Oral iron therapy at this time was reviewed and she is intolerant to oral iron especially in the setting of abdominal pain and dyspepsia.  It is a very possible that oral iron will be ineffective given her poor absorption issues.  The role for intravenous iron was discussed today.  Complication associated with Feraheme infusion was reviewed.  These would include arthralgias, myalgias and anaphylaxis.  After discussion today she is agreeable to proceed and the plan is to give total of 1000 mg in split doses.  We will repeat iron studies in 4 months to ensure stability.  2.  Follow-up: Will be in 4  months  to repeat laboratory testing.  40  minutes was spent with the patient face-to-face today.  More than 50% of time was spent on updating her disease status, reviewing laboratory data, differential diagnosis and management options.   Thank you for the referral.   A copy of this consult has been forwarded to the requesting physician.

## 2019-08-11 ENCOUNTER — Telehealth: Payer: Self-pay | Admitting: Oncology

## 2019-08-11 NOTE — Telephone Encounter (Signed)
Called and left msg. Mailed printout  °

## 2019-08-14 ENCOUNTER — Inpatient Hospital Stay: Payer: Medicare HMO

## 2019-08-14 ENCOUNTER — Other Ambulatory Visit: Payer: Self-pay

## 2019-08-14 VITALS — BP 137/78 | HR 83 | Temp 98.4°F | Resp 18

## 2019-08-14 DIAGNOSIS — F419 Anxiety disorder, unspecified: Secondary | ICD-10-CM | POA: Diagnosis not present

## 2019-08-14 DIAGNOSIS — Z90710 Acquired absence of both cervix and uterus: Secondary | ICD-10-CM | POA: Diagnosis not present

## 2019-08-14 DIAGNOSIS — J45909 Unspecified asthma, uncomplicated: Secondary | ICD-10-CM | POA: Diagnosis not present

## 2019-08-14 DIAGNOSIS — Z7901 Long term (current) use of anticoagulants: Secondary | ICD-10-CM | POA: Diagnosis not present

## 2019-08-14 DIAGNOSIS — R69 Illness, unspecified: Secondary | ICD-10-CM | POA: Diagnosis not present

## 2019-08-14 DIAGNOSIS — D509 Iron deficiency anemia, unspecified: Secondary | ICD-10-CM | POA: Diagnosis not present

## 2019-08-14 DIAGNOSIS — M069 Rheumatoid arthritis, unspecified: Secondary | ICD-10-CM | POA: Diagnosis not present

## 2019-08-14 DIAGNOSIS — Z79899 Other long term (current) drug therapy: Secondary | ICD-10-CM | POA: Diagnosis not present

## 2019-08-14 MED ORDER — SODIUM CHLORIDE 0.9 % IV SOLN
Freq: Once | INTRAVENOUS | Status: AC
  Administered 2019-08-14: 14:00:00 via INTRAVENOUS
  Filled 2019-08-14: qty 250

## 2019-08-14 MED ORDER — ACETAMINOPHEN 325 MG PO TABS
ORAL_TABLET | ORAL | Status: AC
  Filled 2019-08-14: qty 2

## 2019-08-14 MED ORDER — DIPHENHYDRAMINE HCL 25 MG PO CAPS
ORAL_CAPSULE | ORAL | Status: AC
  Filled 2019-08-14: qty 2

## 2019-08-14 MED ORDER — SODIUM CHLORIDE 0.9 % IV SOLN
510.0000 mg | Freq: Once | INTRAVENOUS | Status: AC
  Administered 2019-08-14: 14:00:00 510 mg via INTRAVENOUS
  Filled 2019-08-14: qty 510

## 2019-08-14 MED ORDER — DIPHENHYDRAMINE HCL 25 MG PO CAPS
50.0000 mg | ORAL_CAPSULE | Freq: Once | ORAL | Status: AC
  Administered 2019-08-14: 14:00:00 50 mg via ORAL

## 2019-08-14 MED ORDER — ACETAMINOPHEN 325 MG PO TABS
650.0000 mg | ORAL_TABLET | Freq: Once | ORAL | Status: AC
  Administered 2019-08-14: 14:00:00 650 mg via ORAL

## 2019-08-14 NOTE — Patient Instructions (Signed)

## 2019-08-18 DIAGNOSIS — Z6825 Body mass index (BMI) 25.0-25.9, adult: Secondary | ICD-10-CM | POA: Diagnosis not present

## 2019-08-18 DIAGNOSIS — Z124 Encounter for screening for malignant neoplasm of cervix: Secondary | ICD-10-CM | POA: Diagnosis not present

## 2019-08-18 DIAGNOSIS — Z01419 Encounter for gynecological examination (general) (routine) without abnormal findings: Secondary | ICD-10-CM | POA: Diagnosis not present

## 2019-08-18 DIAGNOSIS — Z779 Other contact with and (suspected) exposures hazardous to health: Secondary | ICD-10-CM | POA: Diagnosis not present

## 2019-08-21 ENCOUNTER — Other Ambulatory Visit: Payer: Self-pay

## 2019-08-21 ENCOUNTER — Inpatient Hospital Stay: Payer: Medicare HMO | Attending: Oncology

## 2019-08-21 VITALS — BP 128/82 | HR 69 | Temp 98.5°F | Resp 18

## 2019-08-21 DIAGNOSIS — D509 Iron deficiency anemia, unspecified: Secondary | ICD-10-CM | POA: Diagnosis present

## 2019-08-21 MED ORDER — DIPHENHYDRAMINE HCL 25 MG PO CAPS
ORAL_CAPSULE | ORAL | Status: AC
  Filled 2019-08-21: qty 2

## 2019-08-21 MED ORDER — SODIUM CHLORIDE 0.9 % IV SOLN
Freq: Once | INTRAVENOUS | Status: AC
  Administered 2019-08-21: 14:00:00 via INTRAVENOUS
  Filled 2019-08-21: qty 250

## 2019-08-21 MED ORDER — SODIUM CHLORIDE 0.9 % IV SOLN
510.0000 mg | Freq: Once | INTRAVENOUS | Status: AC
  Administered 2019-08-21: 14:00:00 510 mg via INTRAVENOUS
  Filled 2019-08-21: qty 17

## 2019-08-21 MED ORDER — ACETAMINOPHEN 325 MG PO TABS
ORAL_TABLET | ORAL | Status: AC
  Filled 2019-08-21: qty 2

## 2019-08-21 MED ORDER — DIPHENHYDRAMINE HCL 25 MG PO CAPS
50.0000 mg | ORAL_CAPSULE | Freq: Once | ORAL | Status: AC
  Administered 2019-08-21: 50 mg via ORAL

## 2019-08-21 MED ORDER — ACETAMINOPHEN 325 MG PO TABS
650.0000 mg | ORAL_TABLET | Freq: Once | ORAL | Status: AC
  Administered 2019-08-21: 14:00:00 650 mg via ORAL

## 2019-08-21 NOTE — Patient Instructions (Signed)
Coronavirus (COVID-19) Are you at risk?  Are you at risk for the Coronavirus (COVID-19)?  To be considered HIGH RISK for Coronavirus (COVID-19), you have to meet the following criteria:  . Traveled to [place], [place], [place], [place] or [place]; or in the [place] to [place], [place], [place], or [place]; and have fever, cough, and shortness of breath within the last 2 weeks of travel OR . Been in close contact with a person diagnosed with COVID-19 within the last 2 weeks and have fever, cough, and shortness of breath . IF YOU DO NOT MEET THESE CRITERIA, YOU ARE CONSIDERED LOW RISK FOR COVID-19.  What to do if you are HIGH RISK for COVID-19?  . If you are having a medical emergency, call 911. . Seek medical care right away. Before you go to a doctor's office, urgent care or emergency department, call ahead and tell them about your recent travel, contact with someone diagnosed with COVID-19, and your symptoms. You should receive instructions from your physician's office regarding next steps of care.  . When you arrive at healthcare provider, tell the healthcare staff immediately you have returned from visiting [place], [place], [place], [place] or [place]; or traveled in the [place] to [place], [place], [place], or [place]; in the last two weeks or you have been in close contact with a person diagnosed with COVID-19 in the last 2 weeks.   . Tell the health care staff about your symptoms: fever, cough and shortness of breath. . After you have been seen by a medical provider, you will be either: o Tested for (COVID-19) and discharged home on quarantine except to seek medical care if symptoms worsen, and asked to  - Stay home and avoid contact with others until you get your results (4-5 days)  - Avoid travel on public transportation if possible (such as bus, train, or airplane) or o Sent to the Emergency Department by EMS for evaluation, COVID-19 testing, and possible  admission depending on your condition and test results.  What to do if you are LOW RISK for COVID-19?  Reduce your risk of any infection by using the same precautions used for avoiding the common cold or flu:  . Wash your hands often with soap and warm water for at least 20 seconds.  If soap and water are not readily available, use an alcohol-based hand sanitizer with at least 60% alcohol.  . If coughing or sneezing, cover your mouth and nose by coughing or sneezing into the elbow areas of your shirt or coat, into a tissue or into your sleeve (not your hands). . Avoid shaking hands with others and consider head nods or verbal greetings only. . Avoid touching your eyes, nose, or mouth with unwashed hands.  . Avoid close contact with people who are sick. . Avoid places or events with large numbers of people in one location, like concerts or sporting events. . Carefully consider travel plans you have or are making. . If you are planning any travel outside or inside the [place], visit the CDC's Travelers' Health webpage for the latest health notices. . If you have some symptoms but not all symptoms, continue to monitor at home and seek medical attention if your symptoms worsen. . If you are having a medical emergency, call 911.   ADDITIONAL HEALTHCARE OPTIONS FOR PATIENTS  [facility] / e-Visit: [email or web address]         [facility]: [phone]  [facility]: [phone]                   [facility]: [phone]   Ferumoxytol injection What is this medicine? FERUMOXYTOL is an iron complex. Iron is used to make healthy red blood cells, which carry oxygen and nutrients throughout the body. This medicine is used to treat iron deficiency anemia. This medicine may be used for other purposes; ask your health care provider or pharmacist if you have questions. COMMON BRAND NAME(S): Feraheme What should I  tell my health care provider before I take this medicine? They need to know if you have any of these conditions:  anemia not caused by low iron levels  high levels of iron in the blood  magnetic resonance imaging (MRI) test scheduled  an unusual or allergic reaction to iron, other medicines, foods, dyes, or preservatives  pregnant or trying to get pregnant  breast-feeding How should I use this medicine? This medicine is for injection into a vein. It is given by a health care professional in a hospital or clinic setting. Talk to your pediatrician regarding the use of this medicine in children. Special care may be needed. Overdosage: If you think you have taken too much of this medicine contact a poison control center or emergency room at once. NOTE: This medicine is only for you. Do not share this medicine with others. What if I miss a dose? It is important not to miss your dose. Call your doctor or health care professional if you are unable to keep an appointment. What may interact with this medicine? This medicine may interact with the following medications:  other iron products This list may not describe all possible interactions. Give your health care provider a list of all the medicines, herbs, non-prescription drugs, or dietary supplements you use. Also tell them if you smoke, drink alcohol, or use illegal drugs. Some items may interact with your medicine. What should I watch for while using this medicine? Visit your doctor or healthcare professional regularly. Tell your doctor or healthcare professional if your symptoms do not start to get better or if they get worse. You may need blood work done while you are taking this medicine. You may need to follow a special diet. Talk to your doctor. Foods that contain iron include: whole grains/cereals, dried fruits, beans, or peas, leafy green vegetables, and organ meats (liver, kidney). What side effects may I notice from receiving this  medicine? Side effects that you should report to your doctor or health care professional as soon as possible:  allergic reactions like skin rash, itching or hives, swelling of the face, lips, or tongue  breathing problems  changes in blood pressure  feeling faint or lightheaded, falls  fever or chills  flushing, sweating, or hot feelings  swelling of the ankles or feet Side effects that usually do not require medical attention (report to your doctor or health care professional if they continue or are bothersome):  diarrhea  headache  nausea, vomiting  stomach pain This list may not describe all possible side effects. Call your doctor for medical advice about side effects. You may report side effects to FDA at 1-800-FDA-1088. Where should I keep my medicine? This drug is given in a hospital or clinic and will not be stored at home. NOTE: This sheet is a summary. It may not cover all possible information. If you have questions about this medicine, talk to your doctor, pharmacist, or health care provider.    2020 Elsevier/Gold Standard (2017-01-21 20:21:10)  

## 2019-08-25 DIAGNOSIS — N3941 Urge incontinence: Secondary | ICD-10-CM | POA: Diagnosis not present

## 2019-08-25 DIAGNOSIS — Z20828 Contact with and (suspected) exposure to other viral communicable diseases: Secondary | ICD-10-CM | POA: Diagnosis not present

## 2019-08-25 DIAGNOSIS — N3010 Interstitial cystitis (chronic) without hematuria: Secondary | ICD-10-CM | POA: Diagnosis not present

## 2019-08-25 DIAGNOSIS — Z01812 Encounter for preprocedural laboratory examination: Secondary | ICD-10-CM | POA: Diagnosis not present

## 2019-08-31 DIAGNOSIS — I10 Essential (primary) hypertension: Secondary | ICD-10-CM | POA: Diagnosis not present

## 2019-08-31 DIAGNOSIS — N3941 Urge incontinence: Secondary | ICD-10-CM | POA: Diagnosis not present

## 2019-08-31 DIAGNOSIS — R69 Illness, unspecified: Secondary | ICD-10-CM | POA: Diagnosis not present

## 2019-08-31 DIAGNOSIS — Z4549 Encounter for adjustment and management of other implanted nervous system device: Secondary | ICD-10-CM | POA: Diagnosis not present

## 2019-08-31 DIAGNOSIS — N3010 Interstitial cystitis (chronic) without hematuria: Secondary | ICD-10-CM | POA: Diagnosis not present

## 2019-09-08 ENCOUNTER — Ambulatory Visit
Admission: RE | Admit: 2019-09-08 | Discharge: 2019-09-08 | Disposition: A | Discharge status: Home / Self Care | Payer: Medicare HMO | Source: Ambulatory Visit | Attending: Obstetrics and Gynecology | Admitting: Obstetrics and Gynecology

## 2019-09-08 ENCOUNTER — Other Ambulatory Visit: Payer: Self-pay

## 2019-09-08 DIAGNOSIS — Z1231 Encounter for screening mammogram for malignant neoplasm of breast: Secondary | ICD-10-CM

## 2019-09-14 DIAGNOSIS — N3010 Interstitial cystitis (chronic) without hematuria: Secondary | ICD-10-CM | POA: Diagnosis not present

## 2019-09-14 DIAGNOSIS — R350 Frequency of micturition: Secondary | ICD-10-CM | POA: Diagnosis not present

## 2019-09-14 DIAGNOSIS — R3915 Urgency of urination: Secondary | ICD-10-CM | POA: Diagnosis not present

## 2019-09-14 DIAGNOSIS — N3941 Urge incontinence: Secondary | ICD-10-CM | POA: Diagnosis not present

## 2019-09-16 DIAGNOSIS — M797 Fibromyalgia: Secondary | ICD-10-CM | POA: Diagnosis not present

## 2019-09-16 DIAGNOSIS — N3010 Interstitial cystitis (chronic) without hematuria: Secondary | ICD-10-CM | POA: Diagnosis not present

## 2019-09-16 DIAGNOSIS — F411 Generalized anxiety disorder: Secondary | ICD-10-CM | POA: Diagnosis not present

## 2019-09-16 DIAGNOSIS — R69 Illness, unspecified: Secondary | ICD-10-CM | POA: Diagnosis not present

## 2019-10-02 DIAGNOSIS — H5203 Hypermetropia, bilateral: Secondary | ICD-10-CM | POA: Diagnosis not present

## 2019-10-07 DIAGNOSIS — Z0100 Encounter for examination of eyes and vision without abnormal findings: Secondary | ICD-10-CM | POA: Diagnosis not present

## 2019-10-26 DIAGNOSIS — M797 Fibromyalgia: Secondary | ICD-10-CM | POA: Diagnosis not present

## 2019-10-26 DIAGNOSIS — Z79899 Other long term (current) drug therapy: Secondary | ICD-10-CM | POA: Diagnosis not present

## 2019-10-26 DIAGNOSIS — M150 Primary generalized (osteo)arthritis: Secondary | ICD-10-CM | POA: Diagnosis not present

## 2019-10-26 DIAGNOSIS — M0609 Rheumatoid arthritis without rheumatoid factor, multiple sites: Secondary | ICD-10-CM | POA: Diagnosis not present

## 2019-10-26 DIAGNOSIS — R5383 Other fatigue: Secondary | ICD-10-CM | POA: Diagnosis not present

## 2019-10-26 DIAGNOSIS — M2550 Pain in unspecified joint: Secondary | ICD-10-CM | POA: Diagnosis not present

## 2019-12-08 ENCOUNTER — Other Ambulatory Visit: Payer: Self-pay

## 2019-12-08 ENCOUNTER — Inpatient Hospital Stay: Payer: Medicare HMO | Attending: Oncology | Admitting: Oncology

## 2019-12-08 ENCOUNTER — Telehealth: Payer: Self-pay

## 2019-12-08 ENCOUNTER — Telehealth: Payer: Self-pay | Admitting: Oncology

## 2019-12-08 ENCOUNTER — Inpatient Hospital Stay: Payer: Medicare HMO

## 2019-12-08 VITALS — BP 164/114 | HR 95 | Temp 98.2°F | Resp 18 | Ht 62.0 in | Wt 136.4 lb

## 2019-12-08 DIAGNOSIS — G4700 Insomnia, unspecified: Secondary | ICD-10-CM | POA: Diagnosis not present

## 2019-12-08 DIAGNOSIS — D509 Iron deficiency anemia, unspecified: Secondary | ICD-10-CM

## 2019-12-08 DIAGNOSIS — K909 Intestinal malabsorption, unspecified: Secondary | ICD-10-CM | POA: Insufficient documentation

## 2019-12-08 DIAGNOSIS — F411 Generalized anxiety disorder: Secondary | ICD-10-CM | POA: Diagnosis not present

## 2019-12-08 DIAGNOSIS — D508 Other iron deficiency anemias: Secondary | ICD-10-CM | POA: Insufficient documentation

## 2019-12-08 DIAGNOSIS — R69 Illness, unspecified: Secondary | ICD-10-CM | POA: Diagnosis not present

## 2019-12-08 LAB — CBC WITH DIFFERENTIAL (CANCER CENTER ONLY)
Abs Immature Granulocytes: 0.01 10*3/uL (ref 0.00–0.07)
Basophils Absolute: 0 10*3/uL (ref 0.0–0.1)
Basophils Relative: 1 %
Eosinophils Absolute: 0.1 10*3/uL (ref 0.0–0.5)
Eosinophils Relative: 2 %
HCT: 37.8 % (ref 36.0–46.0)
Hemoglobin: 12.8 g/dL (ref 12.0–15.0)
Immature Granulocytes: 0 %
Lymphocytes Relative: 41 %
Lymphs Abs: 2.1 10*3/uL (ref 0.7–4.0)
MCH: 31.1 pg (ref 26.0–34.0)
MCHC: 33.9 g/dL (ref 30.0–36.0)
MCV: 92 fL (ref 80.0–100.0)
Monocytes Absolute: 0.4 10*3/uL (ref 0.1–1.0)
Monocytes Relative: 8 %
Neutro Abs: 2.5 10*3/uL (ref 1.7–7.7)
Neutrophils Relative %: 48 %
Platelet Count: 306 10*3/uL (ref 150–400)
RBC: 4.11 MIL/uL (ref 3.87–5.11)
RDW: 13.9 % (ref 11.5–15.5)
WBC Count: 5.2 10*3/uL (ref 4.0–10.5)
nRBC: 0 % (ref 0.0–0.2)

## 2019-12-08 LAB — FERRITIN: Ferritin: 104 ng/mL (ref 11–307)

## 2019-12-08 LAB — IRON AND TIBC
Iron: 74 ug/dL (ref 41–142)
Saturation Ratios: 26 % (ref 21–57)
TIBC: 289 ug/dL (ref 236–444)
UIBC: 215 ug/dL (ref 120–384)

## 2019-12-08 NOTE — Progress Notes (Signed)
Hematology and Oncology Follow Up Visit  ONELIA ZEIEN [id number] [birth year] 53 y.o. 12/08/2019 9:21 AM [name], MDRusso, [name], MD   Principle Diagnosis: 53 year old woman with iron deficiency anemia diagnosed in July 2020.  Iron deficiency is related to difficulty absorbing oral iron.  She was found to have a ferritin of 8 and a hemoglobin of 10 at that time.   Prior Therapy:  She is status post a Feraheme infusion completed in September 2020.   Current therapy: Active surveillance and evaluation for additional iron infusion.  Interim History: Ms. Barsky returns today for a follow-up.  Since the last visit, she received intravenous iron without any complications.  She denies any nausea, fatigue or excessive fatigue.  She did report improvement in her energy after the second iron infusion.  She denies any hematochezia or melena.  She denies any hemoptysis.  She denied any alteration mental status, neuropathy, confusion or dizziness.  Denies any headaches or lethargy.  Denies any night sweats, weight loss or changes in appetite.  Denied orthopnea, dyspnea on exertion or chest discomfort.  Denies shortness of breath, difficulty breathing hemoptysis or cough.  Denies any abdominal distention, nausea, early satiety or dyspepsia.  Denies any hematuria, frequency, dysuria or nocturia.  Denies any skin irritation, dryness or rash.  Denies any ecchymosis or petechiae.  Denies any lymphadenopathy or clotting.  Denies any heat or cold intolerance.  Denies any anxiety or depression.  Remaining review of system is negative.        Medications: I have reviewed the patient's current medications.  Current Outpatient Medications  Medication Sig Dispense Refill  . Biotin 1 MG CAPS Take by mouth.    . calcium carbonate 200 MG capsule Take 250 mg by mouth 2 (two) times daily with a meal.    . clindamycin (CLEOCIN) 150 MG capsule Take 1 mg by mouth 3 (three) times daily.    . clindamycin (CLEOCIN)  150 MG capsule Take 1 capsule (150 mg total) by mouth 3 (three) times daily. (Patient not taking: Reported on 02/11/2018) 21 capsule 0  . enoxaparin (LOVENOX) 40 MG/0.4ML injection Inject 0.4 mLs (40 mg total) into the skin daily. (Patient not taking: Reported on 02/11/2018) 14 Syringe 0  . esomeprazole (NEXIUM) 20 MG capsule Take 20 mg by mouth daily at 12 noon.    . hydrOXYzine (ATARAX/VISTARIL) 25 MG tablet Take 25 mg by mouth 3 (three) times daily as needed.    . Multiple Vitamin (MULTIVITAMIN) tablet Take 1 tablet by mouth daily.    . Omega-3 Fatty Acids (FISH OIL) 1000 MG CAPS Take by mouth.    . oxyCODONE-acetaminophen (PERCOCET/ROXICET) 5-325 MG tablet Take 1-2 tablets by mouth every 4 (four) hours as needed for severe pain.    Marland Kitchen oxyCODONE-acetaminophen (ROXICET) 5-325 MG tablet Take 1 tablet by mouth every 6 (six) hours as needed for severe pain. 20 tablet 0  . phenazopyridine (PYRIDIUM) 100 MG tablet Take 100 mg by mouth 3 (three) times daily as needed.    . promethazine (PHENERGAN) 25 MG tablet Take 1 mg by mouth every 8 (eight) hours as needed for nausea or vomiting.    . Red Yeast Rice Extract (RED YEAST RICE PO) Take by mouth.    . vitamin E 100 UNIT capsule Take by mouth daily.     No current facility-administered medications for this visit.     Allergies:  Allergies  Allergen Reactions  . Benzoin     blister  . Cephalexin   .  Codeine Nausea And Vomiting  . Doxycycline Itching  . Morphine And Related Itching  . Sulfa Antibiotics Hives    Past Medical History, Surgical history, Social history, and Family History were reviewed and updated.    Physical Exam: Blood pressure (!) 164/114, pulse 95, temperature 98.2 F (36.8 C), temperature source Temporal, resp. rate 18, height 5\' 2"  (1.575 m), weight 136 lb 6.4 oz (61.9 kg), SpO2 100 %.   ECOG: 0   General appearance: Comfortable appearing without any discomfort Head: Normocephalic without any trauma Oropharynx:  Mucous membranes are moist and pink without any thrush or ulcers. Eyes: Pupils are equal and round reactive to light. Lymph nodes: No cervical, supraclavicular, inguinal or axillary lymphadenopathy.   Heart:regular rate and rhythm.  S1 and S2 without leg edema. Lung: Clear without any rhonchi or wheezes.  No dullness to percussion. Abdomin: Soft, nontender, nondistended with good bowel sounds.  No hepatosplenomegaly. Musculoskeletal: No joint deformity or effusion.  Full range of motion noted. Neurological: No deficits noted on motor, sensory and deep tendon reflex exam. Skin: No petechial rash or dryness.  Appeared moist.      Lab Results: Lab Results  Component Value Date   WBC 10.3 03/15/2008   HGB 11.5 (L) 10/28/2012   HCT 38.3 03/15/2008   MCV 86.8 03/15/2008   PLT 344 03/15/2008     Chemistry      Component Value Date/Time   NA 139 03/15/2008 1837   K 3.7 03/15/2008 1837   CL 106 03/15/2008 1837   CO2 23 03/15/2008 1837   BUN 13 03/15/2008 1837   CREATININE 0.71 03/15/2008 1837      Component Value Date/Time   CALCIUM 9.8 03/15/2008 1837   ALKPHOS 54 03/15/2008 1837   AST 41 (H) 03/15/2008 1837   ALT 39 (H) 03/15/2008 1837   BILITOT 0.5 03/15/2008 1837         Impression and Plan:   53 year old woman with:  1.  Iron deficiency anemia related to poor oral iron absorption noted in July 2020.  She is status post intravenous IV iron completed in September 2020.  The natural course of her disease as well as future treatment options were reiterated.  Repeat intravenous iron versus oral iron supplementation was discussed.  Her hemoglobin today is back to normal range with iron studies pending.  I have recommended continued active surveillance for the time being and replace with intravenous iron as needed in the future.  He is agreeable with this plan.   2.  Colon cancer screening: She is up-to-date with colonoscopy and endoscopy completed.  3.  Follow-up: She  will return in 4 months for a follow-up.  15  minutes was spent with the patient face-to-face today.  More than 50% of time was spent on reviewing laboratory data, discussing treatment options and complications related to therapy.    Zola Button, MD 12/22/20209:21 AM

## 2019-12-08 NOTE — Telephone Encounter (Signed)
Med list updated

## 2019-12-08 NOTE — Telephone Encounter (Signed)
-----   Message from [name], MD sent at 12/08/2019 11:15 AM EST ----- Please let her know iron studies are normal.

## 2019-12-08 NOTE — Telephone Encounter (Signed)
Called patient and made her aware that her iron studies are normal. Patient verbalized understanding.

## 2019-12-08 NOTE — Telephone Encounter (Signed)
Scheduled appt per 12/22 los.  Left a vm of the appt date and time.

## 2020-01-04 DIAGNOSIS — Z01818 Encounter for other preprocedural examination: Secondary | ICD-10-CM | POA: Diagnosis not present

## 2020-01-04 DIAGNOSIS — Z1159 Encounter for screening for other viral diseases: Secondary | ICD-10-CM | POA: Diagnosis not present

## 2020-01-04 DIAGNOSIS — Z20822 Contact with and (suspected) exposure to covid-19: Secondary | ICD-10-CM | POA: Diagnosis not present

## 2020-01-06 DIAGNOSIS — R69 Illness, unspecified: Secondary | ICD-10-CM | POA: Diagnosis not present

## 2020-01-11 DIAGNOSIS — M7918 Myalgia, other site: Secondary | ICD-10-CM | POA: Diagnosis not present

## 2020-01-11 DIAGNOSIS — Z883 Allergy status to other anti-infective agents status: Secondary | ICD-10-CM | POA: Diagnosis not present

## 2020-01-11 DIAGNOSIS — N329 Bladder disorder, unspecified: Secondary | ICD-10-CM | POA: Diagnosis not present

## 2020-01-11 DIAGNOSIS — N3010 Interstitial cystitis (chronic) without hematuria: Secondary | ICD-10-CM | POA: Diagnosis not present

## 2020-02-03 DIAGNOSIS — M797 Fibromyalgia: Secondary | ICD-10-CM | POA: Diagnosis not present

## 2020-02-03 DIAGNOSIS — E663 Overweight: Secondary | ICD-10-CM | POA: Diagnosis not present

## 2020-02-03 DIAGNOSIS — Z6825 Body mass index (BMI) 25.0-25.9, adult: Secondary | ICD-10-CM | POA: Diagnosis not present

## 2020-02-03 DIAGNOSIS — Z79899 Other long term (current) drug therapy: Secondary | ICD-10-CM | POA: Diagnosis not present

## 2020-02-03 DIAGNOSIS — M2550 Pain in unspecified joint: Secondary | ICD-10-CM | POA: Diagnosis not present

## 2020-02-03 DIAGNOSIS — M150 Primary generalized (osteo)arthritis: Secondary | ICD-10-CM | POA: Diagnosis not present

## 2020-02-03 DIAGNOSIS — M0609 Rheumatoid arthritis without rheumatoid factor, multiple sites: Secondary | ICD-10-CM | POA: Diagnosis not present

## 2020-02-03 DIAGNOSIS — K121 Other forms of stomatitis: Secondary | ICD-10-CM | POA: Diagnosis not present

## 2020-02-18 DIAGNOSIS — G4700 Insomnia, unspecified: Secondary | ICD-10-CM | POA: Diagnosis not present

## 2020-02-18 DIAGNOSIS — D8989 Other specified disorders involving the immune mechanism, not elsewhere classified: Secondary | ICD-10-CM | POA: Diagnosis not present

## 2020-02-18 DIAGNOSIS — R682 Dry mouth, unspecified: Secondary | ICD-10-CM | POA: Diagnosis not present

## 2020-02-18 DIAGNOSIS — R5382 Chronic fatigue, unspecified: Secondary | ICD-10-CM | POA: Diagnosis not present

## 2020-02-18 DIAGNOSIS — M352 Behcet's disease: Secondary | ICD-10-CM | POA: Diagnosis not present

## 2020-02-18 DIAGNOSIS — N3010 Interstitial cystitis (chronic) without hematuria: Secondary | ICD-10-CM | POA: Diagnosis not present

## 2020-02-18 DIAGNOSIS — M797 Fibromyalgia: Secondary | ICD-10-CM | POA: Diagnosis not present

## 2020-02-18 DIAGNOSIS — I10 Essential (primary) hypertension: Secondary | ICD-10-CM | POA: Diagnosis not present

## 2020-02-18 DIAGNOSIS — M069 Rheumatoid arthritis, unspecified: Secondary | ICD-10-CM | POA: Diagnosis not present

## 2020-02-18 DIAGNOSIS — D509 Iron deficiency anemia, unspecified: Secondary | ICD-10-CM | POA: Diagnosis not present

## 2020-02-22 DIAGNOSIS — R05 Cough: Secondary | ICD-10-CM | POA: Diagnosis not present

## 2020-02-23 DIAGNOSIS — N3010 Interstitial cystitis (chronic) without hematuria: Secondary | ICD-10-CM | POA: Diagnosis not present

## 2020-02-23 DIAGNOSIS — R300 Dysuria: Secondary | ICD-10-CM | POA: Diagnosis not present

## 2020-02-23 DIAGNOSIS — N135 Crossing vessel and stricture of ureter without hydronephrosis: Secondary | ICD-10-CM | POA: Diagnosis not present

## 2020-02-23 DIAGNOSIS — R3911 Hesitancy of micturition: Secondary | ICD-10-CM | POA: Diagnosis not present

## 2020-02-23 DIAGNOSIS — N3941 Urge incontinence: Secondary | ICD-10-CM | POA: Diagnosis not present

## 2020-02-23 DIAGNOSIS — I82890 Acute embolism and thrombosis of other specified veins: Secondary | ICD-10-CM | POA: Diagnosis not present

## 2020-02-23 DIAGNOSIS — R350 Frequency of micturition: Secondary | ICD-10-CM | POA: Diagnosis not present

## 2020-02-23 DIAGNOSIS — R5382 Chronic fatigue, unspecified: Secondary | ICD-10-CM | POA: Diagnosis not present

## 2020-02-23 DIAGNOSIS — M797 Fibromyalgia: Secondary | ICD-10-CM | POA: Diagnosis not present

## 2020-03-07 DIAGNOSIS — N3010 Interstitial cystitis (chronic) without hematuria: Secondary | ICD-10-CM | POA: Diagnosis not present

## 2020-04-04 DIAGNOSIS — R3915 Urgency of urination: Secondary | ICD-10-CM | POA: Diagnosis not present

## 2020-04-04 DIAGNOSIS — R350 Frequency of micturition: Secondary | ICD-10-CM | POA: Diagnosis not present

## 2020-04-07 ENCOUNTER — Inpatient Hospital Stay: Payer: Medicare HMO

## 2020-04-07 ENCOUNTER — Inpatient Hospital Stay: Payer: Medicare HMO | Admitting: Oncology

## 2020-04-14 DIAGNOSIS — D649 Anemia, unspecified: Secondary | ICD-10-CM | POA: Diagnosis not present

## 2020-04-14 DIAGNOSIS — Z7689 Persons encountering health services in other specified circumstances: Secondary | ICD-10-CM | POA: Diagnosis not present

## 2020-04-14 DIAGNOSIS — R252 Cramp and spasm: Secondary | ICD-10-CM | POA: Diagnosis not present

## 2020-04-14 DIAGNOSIS — D509 Iron deficiency anemia, unspecified: Secondary | ICD-10-CM | POA: Diagnosis not present

## 2020-04-15 DIAGNOSIS — D509 Iron deficiency anemia, unspecified: Secondary | ICD-10-CM | POA: Diagnosis not present

## 2020-04-20 DIAGNOSIS — R350 Frequency of micturition: Secondary | ICD-10-CM | POA: Diagnosis not present

## 2020-04-20 DIAGNOSIS — N3010 Interstitial cystitis (chronic) without hematuria: Secondary | ICD-10-CM | POA: Diagnosis not present

## 2020-04-20 DIAGNOSIS — Z9682 Presence of neurostimulator: Secondary | ICD-10-CM | POA: Diagnosis not present

## 2020-04-25 ENCOUNTER — Telehealth: Payer: Self-pay | Admitting: Oncology

## 2020-04-25 NOTE — Telephone Encounter (Signed)
Called pt per 5/10 sch message - unable top reach pt . Left message for patient to call back

## 2020-04-26 ENCOUNTER — Inpatient Hospital Stay: Payer: Medicare HMO | Attending: Internal Medicine

## 2020-04-26 ENCOUNTER — Inpatient Hospital Stay: Payer: Medicare HMO | Admitting: Oncology

## 2020-04-29 ENCOUNTER — Telehealth: Payer: Self-pay | Admitting: Oncology

## 2020-04-29 NOTE — Telephone Encounter (Signed)
Called pt per Vmail received on 5/13 - left message for patient to call back to set up appt.

## 2020-05-18 DIAGNOSIS — K580 Irritable bowel syndrome with diarrhea: Secondary | ICD-10-CM | POA: Diagnosis not present

## 2020-05-18 DIAGNOSIS — F411 Generalized anxiety disorder: Secondary | ICD-10-CM | POA: Diagnosis not present

## 2020-05-18 DIAGNOSIS — R69 Illness, unspecified: Secondary | ICD-10-CM | POA: Diagnosis not present

## 2020-05-18 DIAGNOSIS — G4700 Insomnia, unspecified: Secondary | ICD-10-CM | POA: Diagnosis not present

## 2020-06-08 ENCOUNTER — Other Ambulatory Visit: Payer: Self-pay

## 2020-06-08 ENCOUNTER — Inpatient Hospital Stay (HOSPITAL_BASED_OUTPATIENT_CLINIC_OR_DEPARTMENT_OTHER): Payer: Medicare HMO | Admitting: Oncology

## 2020-06-08 ENCOUNTER — Inpatient Hospital Stay: Payer: Medicare HMO | Attending: Internal Medicine

## 2020-06-08 VITALS — BP 116/79 | HR 91 | Temp 97.6°F | Resp 17 | Ht 62.0 in | Wt 142.4 lb

## 2020-06-08 DIAGNOSIS — Z79899 Other long term (current) drug therapy: Secondary | ICD-10-CM | POA: Diagnosis not present

## 2020-06-08 DIAGNOSIS — Z7901 Long term (current) use of anticoagulants: Secondary | ICD-10-CM | POA: Insufficient documentation

## 2020-06-08 DIAGNOSIS — D509 Iron deficiency anemia, unspecified: Secondary | ICD-10-CM

## 2020-06-08 LAB — CBC WITH DIFFERENTIAL (CANCER CENTER ONLY)
Abs Immature Granulocytes: 0.03 10*3/uL (ref 0.00–0.07)
Basophils Absolute: 0.1 10*3/uL (ref 0.0–0.1)
Basophils Relative: 1 %
Eosinophils Absolute: 0.3 10*3/uL (ref 0.0–0.5)
Eosinophils Relative: 5 %
HCT: 36.6 % (ref 36.0–46.0)
Hemoglobin: 12.3 g/dL (ref 12.0–15.0)
Immature Granulocytes: 1 %
Lymphocytes Relative: 32 %
Lymphs Abs: 2.1 10*3/uL (ref 0.7–4.0)
MCH: 29.6 pg (ref 26.0–34.0)
MCHC: 33.6 g/dL (ref 30.0–36.0)
MCV: 88 fL (ref 80.0–100.0)
Monocytes Absolute: 0.6 10*3/uL (ref 0.1–1.0)
Monocytes Relative: 10 %
Neutro Abs: 3.5 10*3/uL (ref 1.7–7.7)
Neutrophils Relative %: 51 %
Platelet Count: 330 10*3/uL (ref 150–400)
RBC: 4.16 MIL/uL (ref 3.87–5.11)
RDW: 13.8 % (ref 11.5–15.5)
WBC Count: 6.6 10*3/uL (ref 4.0–10.5)
nRBC: 0 % (ref 0.0–0.2)

## 2020-06-08 NOTE — Progress Notes (Signed)
Hematology and Oncology Follow Up Visit  Kelly Garcia [id number] 1966/04/24 54 y.o. 06/08/2020 3:33 PM [name], MDRusso, Kelly Reichmann, MD   Principle Diagnosis: 54 year old woman with anemia related to chronic iron deficiency diagnosed in July 2020.    Prior Therapy:  She is status post a Feraheme infusion completed in September 2020.   Current therapy: Repeat intravenous iron infusion as needed.    History of present illness: Kelly Garcia returns today for a follow-up visit.  Since the last visit, she reports no major changes in her health.  She does report some fatigue and tiredness that has been chronic in nature and really unchanged at this time.  She has not reported any hematochezia or melena.  She denies any recent hospitalization or illnesses.    Medications: Reviewed and unchanged. Current Outpatient Medications  Medication Sig Dispense Refill  . Biotin 1 MG CAPS Take by mouth.    . calcium carbonate 200 MG capsule Take 250 mg by mouth 2 (two) times daily with a meal.    . clindamycin (CLEOCIN) 150 MG capsule Take 1 mg by mouth 3 (three) times daily.    . clindamycin (CLEOCIN) 150 MG capsule Take 1 capsule (150 mg total) by mouth 3 (three) times daily. (Patient not taking: Reported on 02/11/2018) 21 capsule 0  . enoxaparin (LOVENOX) 40 MG/0.4ML injection Inject 0.4 mLs (40 mg total) into the skin daily. (Patient not taking: Reported on 02/11/2018) 14 Syringe 0  . esomeprazole (NEXIUM) 20 MG capsule Take 20 mg by mouth daily at 12 noon.    Marland Kitchen FLUoxetine (PROZAC) 20 MG tablet Take 20 mg by mouth daily. 3 tabs a day    . hydrOXYzine (ATARAX/VISTARIL) 25 MG tablet Take 25 mg by mouth 3 (three) times daily as needed.    . Multiple Vitamin (MULTIVITAMIN) tablet Take 1 tablet by mouth daily.    . Omega-3 Fatty Acids (FISH OIL) 1000 MG CAPS Take by mouth.    . oxyCODONE-acetaminophen (PERCOCET/ROXICET) 5-325 MG tablet Take 1-2 tablets by mouth every 4 (four) hours as needed for severe  pain.    Marland Kitchen oxyCODONE-acetaminophen (ROXICET) 5-325 MG tablet Take 1 tablet by mouth every 6 (six) hours as needed for severe pain. 20 tablet 0  . phenazopyridine (PYRIDIUM) 100 MG tablet Take 100 mg by mouth 3 (three) times daily as needed.    . promethazine (PHENERGAN) 25 MG tablet Take 1 mg by mouth every 8 (eight) hours as needed for nausea or vomiting.    . Red Yeast Rice Extract (RED YEAST RICE PO) Take by mouth.    . vitamin E 100 UNIT capsule Take by mouth daily.     No current facility-administered medications for this visit.     Allergies:  Allergies  Allergen Reactions  . Benzoin     blister  . Cephalexin   . Codeine Nausea And Vomiting  . Doxycycline Itching  . Morphine And Related Itching  . Sulfa Antibiotics Hives    .    Physical Exam: Blood pressure 116/79, pulse 91, temperature 97.6 F (36.4 C), temperature source Temporal, resp. rate 17, height 5\' 2"  (1.575 m), weight 142 lb 6.4 oz (64.6 kg), SpO2 99 %.    ECOG: 0   General appearance: Alert, awake without any distress. Head: Atraumatic without abnormalities Oropharynx: Without any thrush or ulcers. Eyes: No scleral icterus. Lymph nodes: No lymphadenopathy noted in the cervical, supraclavicular, or axillary nodes Heart:regular rate and rhythm, without any murmurs or gallops.   Lung:  Clear to auscultation without any rhonchi, wheezes or dullness to percussion. Abdomin: Soft, nontender without any shifting dullness or ascites. Musculoskeletal: No clubbing or cyanosis. Neurological: No motor or sensory deficits. Skin: No rashes or lesions.      Lab Results: Lab Results  Component Value Date   WBC 5.2 12/08/2019   HGB 12.8 12/08/2019   HCT 37.8 12/08/2019   MCV 92.0 12/08/2019   PLT 306 12/08/2019     Chemistry      Component Value Date/Time   NA 139 03/15/2008 1837   K 3.7 03/15/2008 1837   CL 106 03/15/2008 1837   CO2 23 03/15/2008 1837   BUN 13 03/15/2008 1837   CREATININE 0.71  03/15/2008 1837      Component Value Date/Time   CALCIUM 9.8 03/15/2008 1837   ALKPHOS 54 03/15/2008 1837   AST 41 (H) 03/15/2008 1837   ALT 39 (H) 03/15/2008 1837   BILITOT 0.5 03/15/2008 1837         Impression and Plan:   54 year old woman with:  1.  Anemia related to iron deficiency, iron absorption issues diagnosed in 2020.  He status post intravenous iron in September 2020 with normalization of her hemoglobin.  Iron studies obtained in December 2020 showed no maladies with hemoglobin that has been corrected.  At this time I have recommended continued active surveillance with repeat intravenous iron as needed in the future.  She had no complications related to intravenous iron.  Iron studies are currently pending and I will communicate these results to her when they are available.  Her hemoglobin is within normal range however.  2.  Colon cancer screening: No GI issues at this time.  She is up-to-date on her colonoscopy screening.  3.  Follow-up: Will be the next 6 months for repeat follow-up.  20   minutes were dedicated to this encounter.  The time was spent on reviewing laboratory data, treatment options and complications related to therapy.    Zola Button, MD 6/23/20213:33 PM

## 2020-06-09 ENCOUNTER — Telehealth: Payer: Self-pay | Admitting: Oncology

## 2020-06-09 ENCOUNTER — Telehealth: Payer: Self-pay

## 2020-06-09 DIAGNOSIS — M150 Primary generalized (osteo)arthritis: Secondary | ICD-10-CM | POA: Diagnosis not present

## 2020-06-09 DIAGNOSIS — Z79899 Other long term (current) drug therapy: Secondary | ICD-10-CM | POA: Diagnosis not present

## 2020-06-09 DIAGNOSIS — M0609 Rheumatoid arthritis without rheumatoid factor, multiple sites: Secondary | ICD-10-CM | POA: Diagnosis not present

## 2020-06-09 DIAGNOSIS — M797 Fibromyalgia: Secondary | ICD-10-CM | POA: Diagnosis not present

## 2020-06-09 DIAGNOSIS — E663 Overweight: Secondary | ICD-10-CM | POA: Diagnosis not present

## 2020-06-09 DIAGNOSIS — Z6826 Body mass index (BMI) 26.0-26.9, adult: Secondary | ICD-10-CM | POA: Diagnosis not present

## 2020-06-09 DIAGNOSIS — K1379 Other lesions of oral mucosa: Secondary | ICD-10-CM | POA: Diagnosis not present

## 2020-06-09 DIAGNOSIS — M2550 Pain in unspecified joint: Secondary | ICD-10-CM | POA: Diagnosis not present

## 2020-06-09 LAB — IRON AND TIBC
Iron: 63 ug/dL (ref 41–142)
Saturation Ratios: 17 % — ABNORMAL LOW (ref 21–57)
TIBC: 365 ug/dL (ref 236–444)
UIBC: 302 ug/dL (ref 120–384)

## 2020-06-09 LAB — FERRITIN: Ferritin: 39 ng/mL (ref 11–307)

## 2020-06-09 NOTE — Telephone Encounter (Signed)
Called patient and let her know Dr. Hazeline Junker instructions. She verbalized understanding and stated she already received a call from scheduling for an iron infusion on 06/16/20.

## 2020-06-09 NOTE — Telephone Encounter (Signed)
Called patient and left message to return call to Dr. Hazeline Junker nurse at [phone] for lab results.

## 2020-06-09 NOTE — Addendum Note (Signed)
Addended by: [name] on: 06/09/2020 09:16 AM   Modules accepted: Orders

## 2020-06-09 NOTE — Telephone Encounter (Signed)
Scheduled per 6/23 los. Pt is aware of appt time and date.

## 2020-06-09 NOTE — Telephone Encounter (Signed)
-----   Message from [name], MD sent at 06/09/2020  9:15 AM EDT ----- Please let her know her iron is dropping slightly with low saturation. I recommend IV iron infusion. She will need one infusion this time. I will send a message to scheduling.

## 2020-06-09 NOTE — Telephone Encounter (Signed)
Scheduled appt per 6/24 sch message - unable to reach pt . Left message with appt date and time

## 2020-06-16 ENCOUNTER — Other Ambulatory Visit: Payer: Self-pay

## 2020-06-16 ENCOUNTER — Inpatient Hospital Stay: Payer: Medicare HMO | Attending: Internal Medicine

## 2020-06-16 VITALS — BP 122/69 | HR 71 | Temp 98.1°F | Resp 18

## 2020-06-16 DIAGNOSIS — D509 Iron deficiency anemia, unspecified: Secondary | ICD-10-CM

## 2020-06-16 MED ORDER — SODIUM CHLORIDE 0.9 % IV SOLN
Freq: Once | INTRAVENOUS | Status: AC
  Filled 2020-06-16: qty 250

## 2020-06-16 MED ORDER — SODIUM CHLORIDE 0.9 % IV SOLN
510.0000 mg | Freq: Once | INTRAVENOUS | Status: AC
  Administered 2020-06-16: 510 mg via INTRAVENOUS
  Filled 2020-06-16: qty 510

## 2020-06-16 NOTE — Patient Instructions (Signed)

## 2020-06-22 DIAGNOSIS — R69 Illness, unspecified: Secondary | ICD-10-CM | POA: Diagnosis not present

## 2020-08-09 DIAGNOSIS — Z20822 Contact with and (suspected) exposure to covid-19: Secondary | ICD-10-CM | POA: Diagnosis not present

## 2020-08-10 ENCOUNTER — Other Ambulatory Visit: Payer: Self-pay | Admitting: Obstetrics and Gynecology

## 2020-08-10 DIAGNOSIS — Z1231 Encounter for screening mammogram for malignant neoplasm of breast: Secondary | ICD-10-CM

## 2020-08-12 DIAGNOSIS — N3010 Interstitial cystitis (chronic) without hematuria: Secondary | ICD-10-CM | POA: Diagnosis not present

## 2020-08-12 DIAGNOSIS — R350 Frequency of micturition: Secondary | ICD-10-CM | POA: Diagnosis not present

## 2020-08-12 DIAGNOSIS — R3915 Urgency of urination: Secondary | ICD-10-CM | POA: Diagnosis not present

## 2020-08-12 DIAGNOSIS — M7918 Myalgia, other site: Secondary | ICD-10-CM | POA: Diagnosis not present

## 2020-08-23 DIAGNOSIS — H6991 Unspecified Eustachian tube disorder, right ear: Secondary | ICD-10-CM | POA: Diagnosis not present

## 2020-08-31 DIAGNOSIS — M542 Cervicalgia: Secondary | ICD-10-CM | POA: Diagnosis not present

## 2020-08-31 DIAGNOSIS — G501 Atypical facial pain: Secondary | ICD-10-CM | POA: Diagnosis not present

## 2020-08-31 DIAGNOSIS — H90A22 Sensorineural hearing loss, unilateral, left ear, with restricted hearing on the contralateral side: Secondary | ICD-10-CM | POA: Diagnosis not present

## 2020-08-31 DIAGNOSIS — R682 Dry mouth, unspecified: Secondary | ICD-10-CM | POA: Diagnosis not present

## 2020-08-31 DIAGNOSIS — H6983 Other specified disorders of Eustachian tube, bilateral: Secondary | ICD-10-CM | POA: Diagnosis not present

## 2020-08-31 DIAGNOSIS — H90A31 Mixed conductive and sensorineural hearing loss, unilateral, right ear with restricted hearing on the contralateral side: Secondary | ICD-10-CM | POA: Diagnosis not present

## 2020-08-31 DIAGNOSIS — R42 Dizziness and giddiness: Secondary | ICD-10-CM | POA: Diagnosis not present

## 2020-09-02 DIAGNOSIS — H90A22 Sensorineural hearing loss, unilateral, left ear, with restricted hearing on the contralateral side: Secondary | ICD-10-CM | POA: Diagnosis not present

## 2020-09-02 DIAGNOSIS — G501 Atypical facial pain: Secondary | ICD-10-CM | POA: Diagnosis not present

## 2020-09-02 DIAGNOSIS — M542 Cervicalgia: Secondary | ICD-10-CM | POA: Diagnosis not present

## 2020-09-02 DIAGNOSIS — H90A31 Mixed conductive and sensorineural hearing loss, unilateral, right ear with restricted hearing on the contralateral side: Secondary | ICD-10-CM | POA: Diagnosis not present

## 2020-09-02 DIAGNOSIS — H6983 Other specified disorders of Eustachian tube, bilateral: Secondary | ICD-10-CM | POA: Diagnosis not present

## 2020-09-02 DIAGNOSIS — R682 Dry mouth, unspecified: Secondary | ICD-10-CM | POA: Diagnosis not present

## 2020-09-02 DIAGNOSIS — R42 Dizziness and giddiness: Secondary | ICD-10-CM | POA: Diagnosis not present

## 2020-09-08 IMAGING — MG MM DIGITAL SCREENING BILAT W/ TOMO W/ CAD
8 series · 8 of 24 positions shown · non-contrast
Comparison: Previous exam(s).

CLINICAL DATA: Screening.

EXAM:
DIGITAL SCREENING BILATERAL MAMMOGRAM WITH TOMO AND CAD

[L MLO synth-2D]
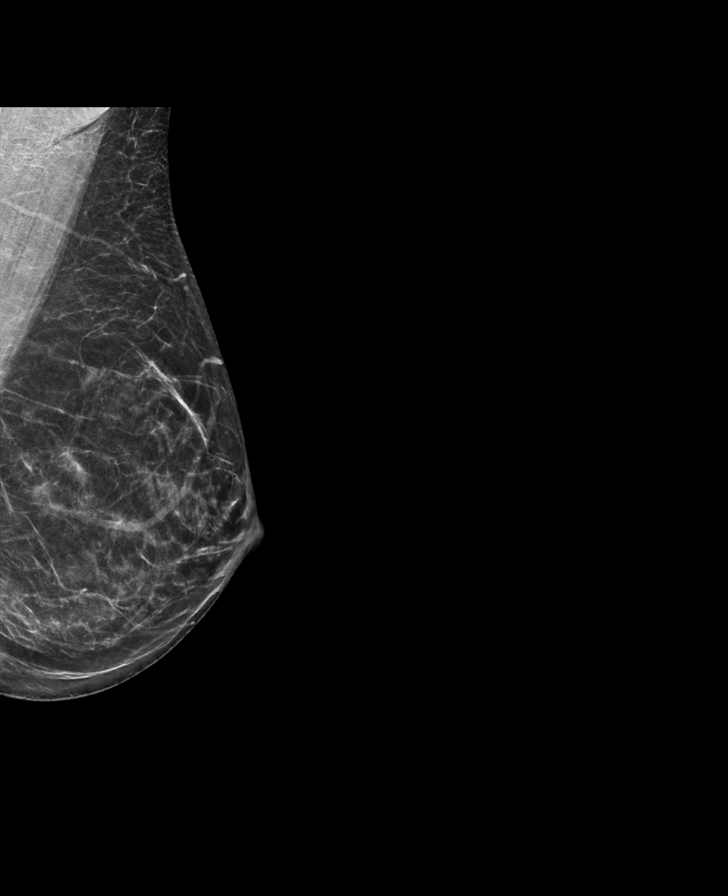

[R CC synth-2D]
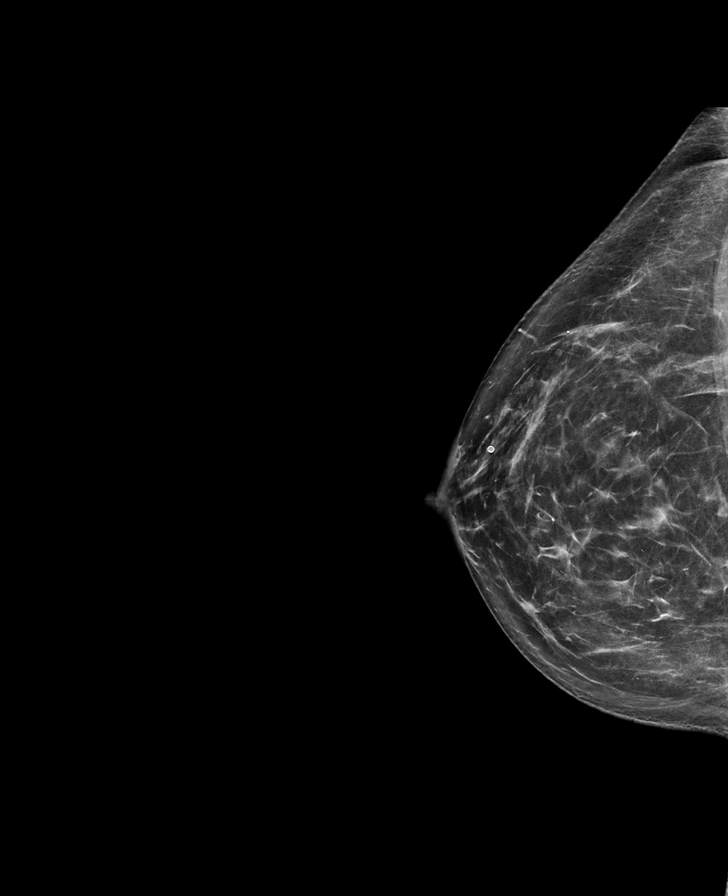

[L CC synth-2D]
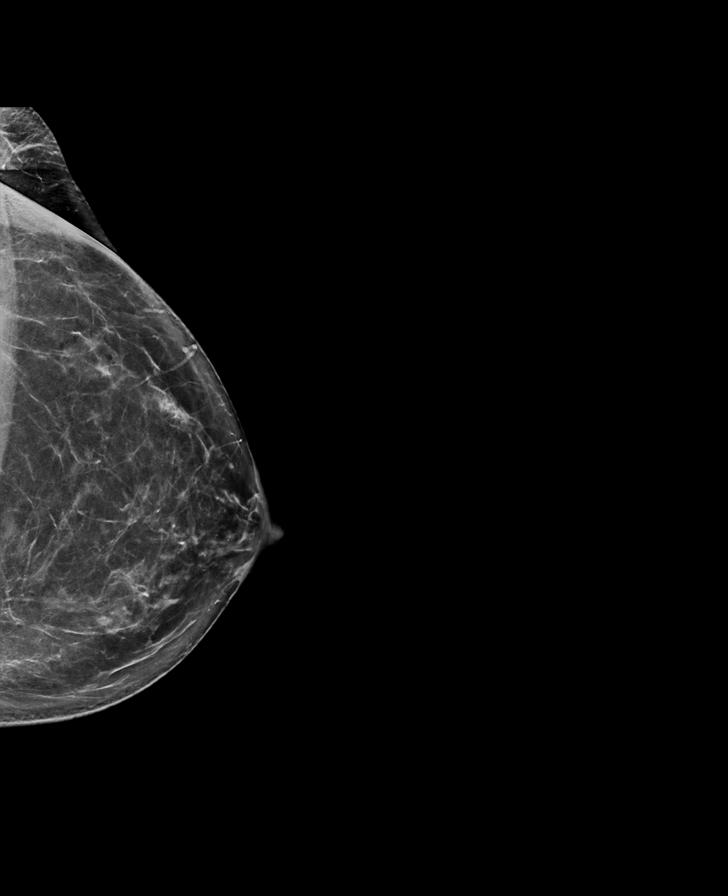

[R MLO synth-2D]
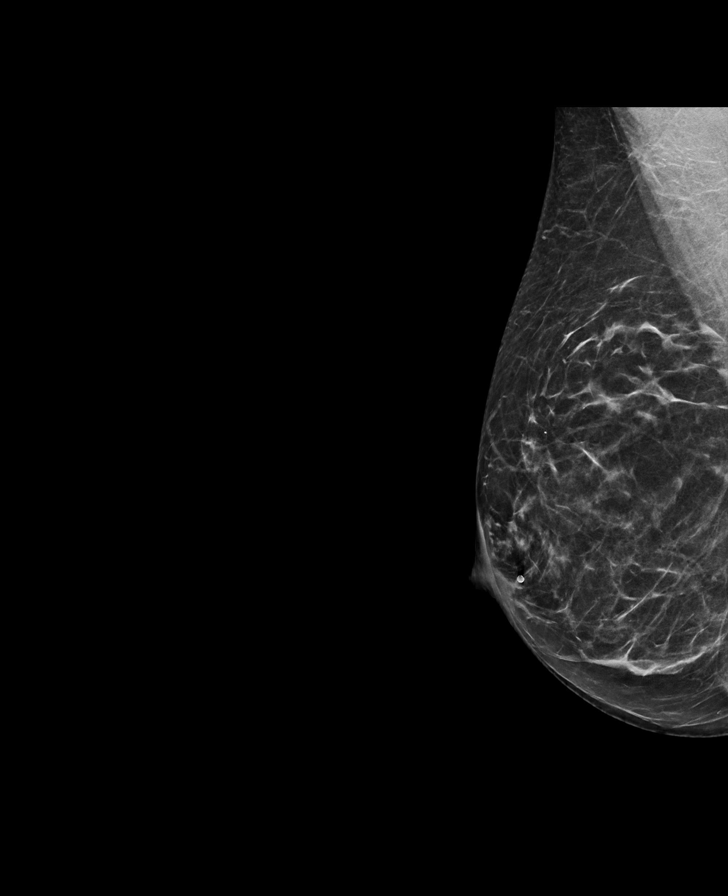

[R MLO tomo · tomo slice 34/67.0]
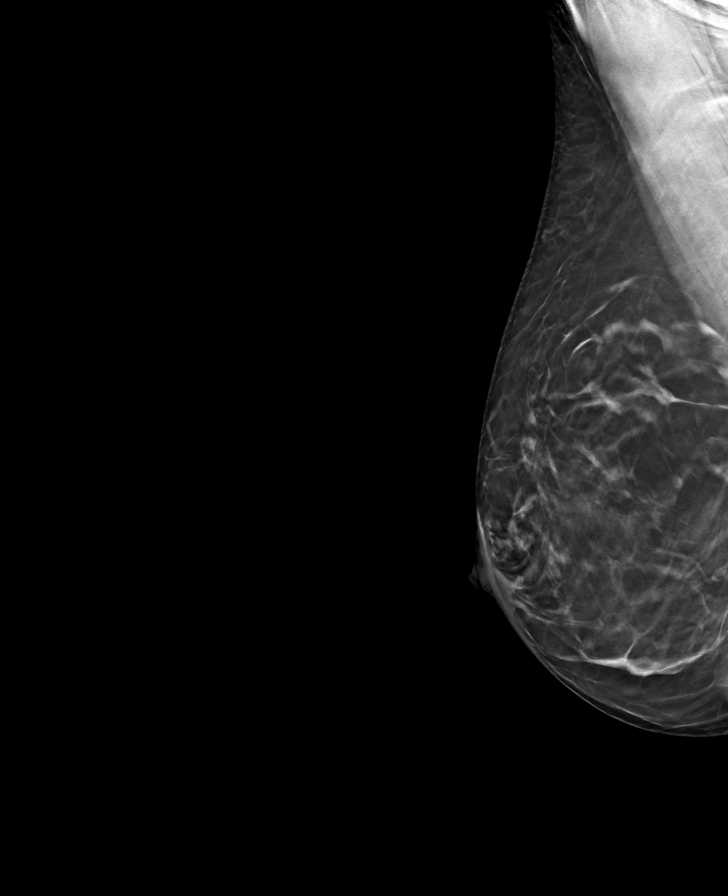

[L CC tomo · tomo slice 35/68.0]
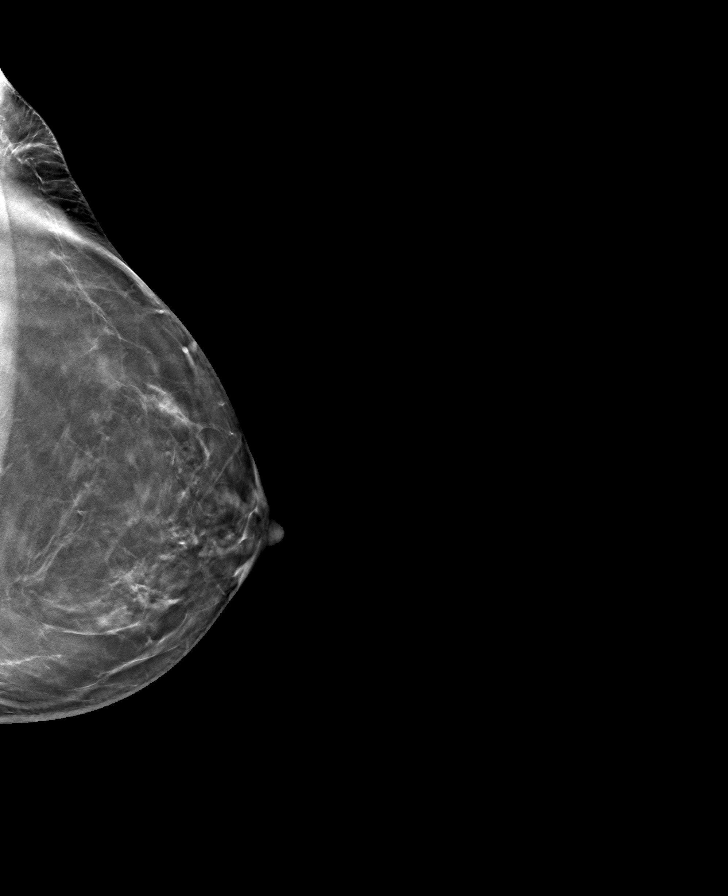

[L MLO tomo · tomo slice 31/61.0]
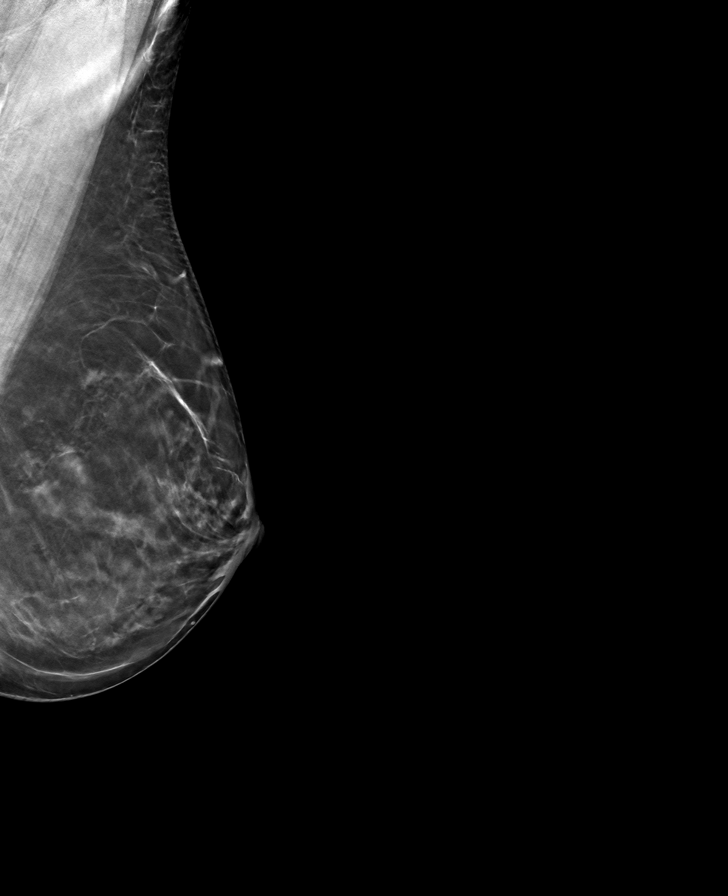

[R CC tomo · tomo slice 35/70.0]
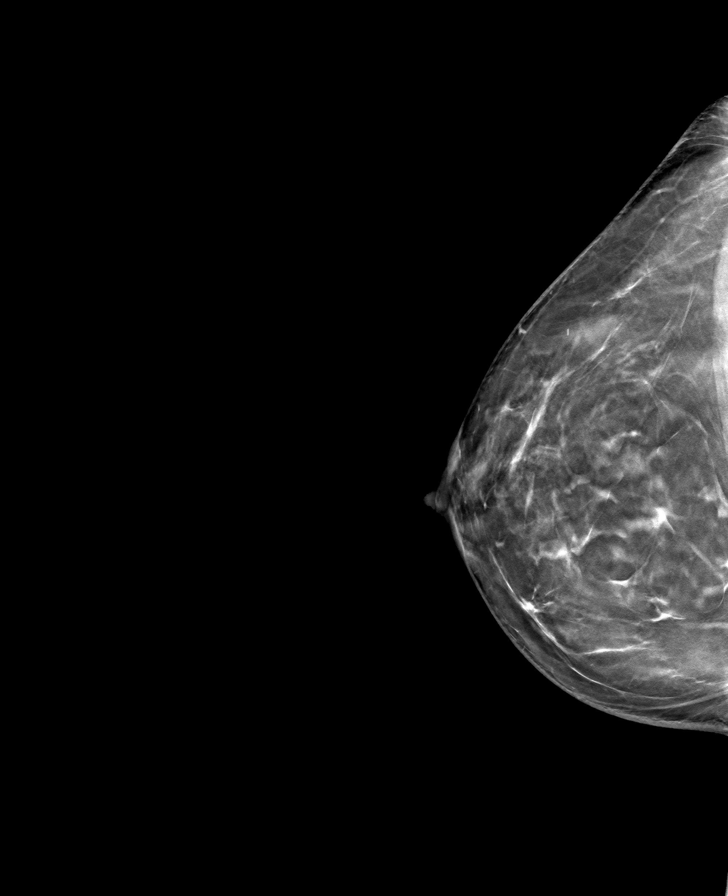

[8 of 24 positions shown; findings below may reference images not displayed]

ACR Breast Density Category b: There are scattered areas of
fibroglandular density.
FINDINGS: There are no findings suspicious for malignancy. Images were
processed with CAD.
IMPRESSION: No mammographic evidence of malignancy. A result letter of this
screening mammogram will be mailed directly to the patient.

RECOMMENDATION:
Screening mammogram in one year. (Code:[id number])

BI-RADS CATEGORY  1: Negative.

## 2020-09-15 ENCOUNTER — Ambulatory Visit: Payer: Medicare HMO

## 2020-09-15 DIAGNOSIS — J302 Other seasonal allergic rhinitis: Secondary | ICD-10-CM | POA: Diagnosis not present

## 2020-09-15 DIAGNOSIS — I10 Essential (primary) hypertension: Secondary | ICD-10-CM | POA: Diagnosis not present

## 2020-09-15 DIAGNOSIS — G501 Atypical facial pain: Secondary | ICD-10-CM | POA: Diagnosis not present

## 2020-09-15 DIAGNOSIS — R682 Dry mouth, unspecified: Secondary | ICD-10-CM | POA: Diagnosis not present

## 2020-09-15 DIAGNOSIS — J329 Chronic sinusitis, unspecified: Secondary | ICD-10-CM | POA: Diagnosis not present

## 2020-09-24 DIAGNOSIS — R42 Dizziness and giddiness: Secondary | ICD-10-CM | POA: Diagnosis not present

## 2020-09-26 DIAGNOSIS — L508 Other urticaria: Secondary | ICD-10-CM | POA: Diagnosis not present

## 2020-09-26 DIAGNOSIS — J3089 Other allergic rhinitis: Secondary | ICD-10-CM | POA: Diagnosis not present

## 2020-09-26 DIAGNOSIS — R682 Dry mouth, unspecified: Secondary | ICD-10-CM | POA: Diagnosis not present

## 2020-09-26 DIAGNOSIS — J302 Other seasonal allergic rhinitis: Secondary | ICD-10-CM | POA: Diagnosis not present

## 2020-09-26 DIAGNOSIS — Z0182 Encounter for allergy testing: Secondary | ICD-10-CM | POA: Diagnosis not present

## 2020-09-26 DIAGNOSIS — G501 Atypical facial pain: Secondary | ICD-10-CM | POA: Diagnosis not present

## 2020-09-26 DIAGNOSIS — K1121 Acute sialoadenitis: Secondary | ICD-10-CM | POA: Diagnosis not present

## 2020-09-26 DIAGNOSIS — J301 Allergic rhinitis due to pollen: Secondary | ICD-10-CM | POA: Diagnosis not present

## 2020-09-26 DIAGNOSIS — J3081 Allergic rhinitis due to animal (cat) (dog) hair and dander: Secondary | ICD-10-CM | POA: Diagnosis not present

## 2020-10-11 ENCOUNTER — Ambulatory Visit: Payer: Medicare HMO

## 2020-10-11 DIAGNOSIS — M0609 Rheumatoid arthritis without rheumatoid factor, multiple sites: Secondary | ICD-10-CM | POA: Diagnosis not present

## 2020-10-11 DIAGNOSIS — M150 Primary generalized (osteo)arthritis: Secondary | ICD-10-CM | POA: Diagnosis not present

## 2020-10-11 DIAGNOSIS — E663 Overweight: Secondary | ICD-10-CM | POA: Diagnosis not present

## 2020-10-11 DIAGNOSIS — J301 Allergic rhinitis due to pollen: Secondary | ICD-10-CM | POA: Diagnosis not present

## 2020-10-11 DIAGNOSIS — Z6826 Body mass index (BMI) 26.0-26.9, adult: Secondary | ICD-10-CM | POA: Diagnosis not present

## 2020-10-11 DIAGNOSIS — E8770 Fluid overload, unspecified: Secondary | ICD-10-CM | POA: Diagnosis not present

## 2020-10-11 DIAGNOSIS — J3081 Allergic rhinitis due to animal (cat) (dog) hair and dander: Secondary | ICD-10-CM | POA: Diagnosis not present

## 2020-10-11 DIAGNOSIS — Z79899 Other long term (current) drug therapy: Secondary | ICD-10-CM | POA: Diagnosis not present

## 2020-10-11 DIAGNOSIS — M3500 Sicca syndrome, unspecified: Secondary | ICD-10-CM | POA: Diagnosis not present

## 2020-10-11 DIAGNOSIS — M2550 Pain in unspecified joint: Secondary | ICD-10-CM | POA: Diagnosis not present

## 2020-10-11 DIAGNOSIS — J3089 Other allergic rhinitis: Secondary | ICD-10-CM | POA: Diagnosis not present

## 2020-10-11 DIAGNOSIS — M797 Fibromyalgia: Secondary | ICD-10-CM | POA: Diagnosis not present

## 2020-10-19 ENCOUNTER — Telehealth: Payer: Self-pay

## 2020-10-19 DIAGNOSIS — Z0100 Encounter for examination of eyes and vision without abnormal findings: Secondary | ICD-10-CM | POA: Diagnosis not present

## 2020-10-19 NOTE — Telephone Encounter (Signed)
-----   Message from [name], MD sent at 10/19/2020  3:15 PM EDT ----- I reviewed her labs. No major changes noted but if she is feeling different than before I will move her labs to a sooner date. Thanks ----- Message ----- From: Kennedy Bucker, LPN Sent: 24/03/101   3:09 PM EDT To: [name], MD  Patient called in stating that her rheumatologist thought she should been seen sooner in office because of her labs results. Lab results are on your desk. Please advise.   Kim LPN

## 2020-10-19 NOTE — Telephone Encounter (Signed)
Returned patient's call per Dr Alen Blew. Advised patient to call the office if she is feeling worst. Patient verbalized understanding.

## 2020-11-04 DIAGNOSIS — J3089 Other allergic rhinitis: Secondary | ICD-10-CM | POA: Diagnosis not present

## 2020-11-04 DIAGNOSIS — J301 Allergic rhinitis due to pollen: Secondary | ICD-10-CM | POA: Diagnosis not present

## 2020-11-04 DIAGNOSIS — J3081 Allergic rhinitis due to animal (cat) (dog) hair and dander: Secondary | ICD-10-CM | POA: Diagnosis not present

## 2020-11-09 DIAGNOSIS — J302 Other seasonal allergic rhinitis: Secondary | ICD-10-CM | POA: Diagnosis not present

## 2020-11-09 DIAGNOSIS — G501 Atypical facial pain: Secondary | ICD-10-CM | POA: Diagnosis not present

## 2020-11-09 DIAGNOSIS — L508 Other urticaria: Secondary | ICD-10-CM | POA: Diagnosis not present

## 2020-11-09 DIAGNOSIS — I10 Essential (primary) hypertension: Secondary | ICD-10-CM | POA: Diagnosis not present

## 2020-11-09 DIAGNOSIS — J3089 Other allergic rhinitis: Secondary | ICD-10-CM | POA: Diagnosis not present

## 2020-11-09 DIAGNOSIS — R682 Dry mouth, unspecified: Secondary | ICD-10-CM | POA: Diagnosis not present

## 2020-11-09 DIAGNOSIS — J301 Allergic rhinitis due to pollen: Secondary | ICD-10-CM | POA: Diagnosis not present

## 2020-11-09 DIAGNOSIS — K1121 Acute sialoadenitis: Secondary | ICD-10-CM | POA: Diagnosis not present

## 2020-11-09 DIAGNOSIS — J3081 Allergic rhinitis due to animal (cat) (dog) hair and dander: Secondary | ICD-10-CM | POA: Diagnosis not present

## 2020-11-16 DIAGNOSIS — Z658 Other specified problems related to psychosocial circumstances: Secondary | ICD-10-CM | POA: Diagnosis not present

## 2020-11-16 DIAGNOSIS — F411 Generalized anxiety disorder: Secondary | ICD-10-CM | POA: Diagnosis not present

## 2020-11-16 DIAGNOSIS — R69 Illness, unspecified: Secondary | ICD-10-CM | POA: Diagnosis not present

## 2020-11-18 DIAGNOSIS — J3081 Allergic rhinitis due to animal (cat) (dog) hair and dander: Secondary | ICD-10-CM | POA: Diagnosis not present

## 2020-11-18 DIAGNOSIS — J301 Allergic rhinitis due to pollen: Secondary | ICD-10-CM | POA: Diagnosis not present

## 2020-11-18 DIAGNOSIS — J3089 Other allergic rhinitis: Secondary | ICD-10-CM | POA: Diagnosis not present

## 2020-11-25 DIAGNOSIS — J3089 Other allergic rhinitis: Secondary | ICD-10-CM | POA: Diagnosis not present

## 2020-11-25 DIAGNOSIS — J301 Allergic rhinitis due to pollen: Secondary | ICD-10-CM | POA: Diagnosis not present

## 2020-11-25 DIAGNOSIS — J3081 Allergic rhinitis due to animal (cat) (dog) hair and dander: Secondary | ICD-10-CM | POA: Diagnosis not present

## 2020-12-07 ENCOUNTER — Other Ambulatory Visit: Payer: Self-pay | Admitting: Oncology

## 2020-12-07 DIAGNOSIS — D509 Iron deficiency anemia, unspecified: Secondary | ICD-10-CM

## 2020-12-08 ENCOUNTER — Inpatient Hospital Stay: Payer: Medicare HMO

## 2020-12-08 ENCOUNTER — Ambulatory Visit
Admission: RE | Admit: 2020-12-08 | Discharge: 2020-12-08 | Disposition: A | Discharge status: Home / Self Care | Payer: Medicare HMO | Source: Ambulatory Visit | Attending: Obstetrics and Gynecology | Admitting: Obstetrics and Gynecology

## 2020-12-08 ENCOUNTER — Other Ambulatory Visit: Payer: Self-pay

## 2020-12-08 ENCOUNTER — Inpatient Hospital Stay: Payer: Medicare HMO | Attending: Oncology | Admitting: Oncology

## 2020-12-08 VITALS — BP 135/81 | HR 78 | Temp 98.7°F | Resp 17 | Ht 62.0 in | Wt 143.7 lb

## 2020-12-08 DIAGNOSIS — K909 Intestinal malabsorption, unspecified: Secondary | ICD-10-CM | POA: Diagnosis present

## 2020-12-08 DIAGNOSIS — K589 Irritable bowel syndrome without diarrhea: Secondary | ICD-10-CM | POA: Insufficient documentation

## 2020-12-08 DIAGNOSIS — Z79899 Other long term (current) drug therapy: Secondary | ICD-10-CM | POA: Insufficient documentation

## 2020-12-08 DIAGNOSIS — D509 Iron deficiency anemia, unspecified: Secondary | ICD-10-CM | POA: Diagnosis not present

## 2020-12-08 DIAGNOSIS — Z1231 Encounter for screening mammogram for malignant neoplasm of breast: Secondary | ICD-10-CM | POA: Diagnosis not present

## 2020-12-08 DIAGNOSIS — D508 Other iron deficiency anemias: Secondary | ICD-10-CM | POA: Insufficient documentation

## 2020-12-08 LAB — CBC WITH DIFFERENTIAL (CANCER CENTER ONLY)
Abs Immature Granulocytes: 0.03 10*3/uL (ref 0.00–0.07)
Basophils Absolute: 0 10*3/uL (ref 0.0–0.1)
Basophils Relative: 1 %
Eosinophils Absolute: 0.3 10*3/uL (ref 0.0–0.5)
Eosinophils Relative: 4 %
HCT: 32 % — ABNORMAL LOW (ref 36.0–46.0)
Hemoglobin: 10.2 g/dL — ABNORMAL LOW (ref 12.0–15.0)
Immature Granulocytes: 1 %
Lymphocytes Relative: 39 %
Lymphs Abs: 2.6 10*3/uL (ref 0.7–4.0)
MCH: 26.9 pg (ref 26.0–34.0)
MCHC: 31.9 g/dL (ref 30.0–36.0)
MCV: 84.4 fL (ref 80.0–100.0)
Monocytes Absolute: 0.6 10*3/uL (ref 0.1–1.0)
Monocytes Relative: 8 %
Neutro Abs: 3.1 10*3/uL (ref 1.7–7.7)
Neutrophils Relative %: 47 %
Platelet Count: 334 10*3/uL (ref 150–400)
RBC: 3.79 MIL/uL — ABNORMAL LOW (ref 3.87–5.11)
RDW: 14.7 % (ref 11.5–15.5)
WBC Count: 6.6 10*3/uL (ref 4.0–10.5)
nRBC: 0 % (ref 0.0–0.2)

## 2020-12-08 NOTE — Progress Notes (Signed)
Hematology and Oncology Follow Up Visit  Kelly Garcia [id number] 06/30/1966 54 y.o. 12/08/2020 2:15 PM [name], MDRusso, [name], MD   Principle Diagnosis: 54 year old woman with iron deficiency anemia diagnosed in July 2020.  Iron deficiency is related to poor oral iron absorption.    Prior Therapy:  She is status post a Feraheme infusion completed in September 2020.  This was repeated in July 2021.     Current therapy: Active surveillance and periodic repeat intravenous iron.    History of present illness: [name] returns today for repeat follow-up.  Since the last visit, she reports no major changes in her health.  She does report of chronic dyspepsia and irritable bowel symptoms for which she requires antispasmodic agents.  He denies any hematochezia, melena or hemoptysis.  She denies any recent hospitalization or illnesses.  She does report some fatigue and tiredness as of late.    Medications: Updated on review. Current Outpatient Medications  Medication Sig Dispense Refill  . Biotin 1 MG CAPS Take by mouth.    . calcium carbonate 200 MG capsule Take 250 mg by mouth 2 (two) times daily with a meal.    . clindamycin (CLEOCIN) 150 MG capsule Take 1 mg by mouth 3 (three) times daily.    . clindamycin (CLEOCIN) 150 MG capsule Take 1 capsule (150 mg total) by mouth 3 (three) times daily. (Patient not taking: Reported on 02/11/2018) 21 capsule 0  . enoxaparin (LOVENOX) 40 MG/0.4ML injection Inject 0.4 mLs (40 mg total) into the skin daily. (Patient not taking: Reported on 02/11/2018) 14 Syringe 0  . esomeprazole (NEXIUM) 20 MG capsule Take 20 mg by mouth daily at 12 noon. (Patient not taking: Reported on 06/08/2020)    . FLUoxetine (PROZAC) 20 MG tablet Take 20 mg by mouth daily. 3 tabs a day    . hydrOXYzine (ATARAX/VISTARIL) 25 MG tablet Take 25 mg by mouth 3 (three) times daily as needed.    . Multiple Vitamin (MULTIVITAMIN) tablet Take 1 tablet by mouth daily. (Patient not  taking: Reported on 06/08/2020)    . Omega-3 Fatty Acids (FISH OIL) 1000 MG CAPS Take by mouth.    . oxyCODONE-acetaminophen (PERCOCET/ROXICET) 5-325 MG tablet Take 1-2 tablets by mouth every 4 (four) hours as needed for severe pain.    Marland Kitchen oxyCODONE-acetaminophen (ROXICET) 5-325 MG tablet Take 1 tablet by mouth every 6 (six) hours as needed for severe pain. 20 tablet 0  . phenazopyridine (PYRIDIUM) 100 MG tablet Take 100 mg by mouth 3 (three) times daily as needed. (Patient not taking: Reported on 06/08/2020)    . promethazine (PHENERGAN) 25 MG tablet Take 1 mg by mouth every 8 (eight) hours as needed for nausea or vomiting. (Patient not taking: Reported on 06/08/2020)    . Red Yeast Rice Extract (RED YEAST RICE PO) Take by mouth. (Patient not taking: Reported on 06/08/2020)    . vitamin E 100 UNIT capsule Take by mouth daily.     No current facility-administered medications for this visit.     Allergies:  Allergies  Allergen Reactions  . Benzoin     blister  . Cephalexin   . Codeine Nausea And Vomiting  . Doxycycline Itching  . Morphine And Related Itching  . Sulfa Antibiotics Hives    .    Physical Exam:  Blood pressure 135/81, pulse 78, temperature 98.7 F (37.1 C), temperature source Tympanic, resp. rate 17, height 5\' 2"  (1.575 m), weight 143 lb 11.2 oz (65.2 kg), SpO2  99 %.    ECOG: 0   General appearance: Comfortable appearing without any discomfort Head: Normocephalic without any trauma Oropharynx: Mucous membranes are moist and pink without any thrush or ulcers. Eyes: Pupils are equal and round reactive to light. Lymph nodes: No cervical, supraclavicular, inguinal or axillary lymphadenopathy.   Heart:regular rate and rhythm.  S1 and S2 without leg edema. Lung: Clear without any rhonchi or wheezes.  No dullness to percussion. Abdomin: Soft, nontender, nondistended with good bowel sounds.  No hepatosplenomegaly. Musculoskeletal: No joint deformity or effusion.  Full  range of motion noted. Neurological: No deficits noted on motor, sensory and deep tendon reflex exam. Skin: No petechial rash or dryness.  Appeared moist.        Lab Results: Lab Results  Component Value Date   WBC 6.6 06/08/2020   HGB 12.3 06/08/2020   HCT 36.6 06/08/2020   MCV 88.0 06/08/2020   PLT 330 06/08/2020     Chemistry      Component Value Date/Time   NA 139 03/15/2008 1837   K 3.7 03/15/2008 1837   CL 106 03/15/2008 1837   CO2 23 03/15/2008 1837   BUN 13 03/15/2008 1837   CREATININE 0.71 03/15/2008 1837      Component Value Date/Time   CALCIUM 9.8 03/15/2008 1837   ALKPHOS 54 03/15/2008 1837   AST 41 (H) 03/15/2008 1837   ALT 39 (H) 03/15/2008 1837   BILITOT 0.5 03/15/2008 1837     Results for Kelly, Garcia (MRN [id number]) as of 12/08/2020 14:22  Ref. Range 12/08/2019 09:13 06/08/2020 15:27  Iron Latest Ref Range: 41 - 142 ug/dL 74 63  UIBC Latest Ref Range: 120 - 384 ug/dL 215 302  TIBC Latest Ref Range: 236 - 444 ug/dL 289 365  Saturation Ratios Latest Ref Range: 21 - 57 % 26 17 (L)  Ferritin Latest Ref Range: 11 - 307 ng/mL 104 39      Impression and Plan:   54 year old woman with:  1.  Iron deficiency anemia related to iron absorption issues diagnosed in 2020.    She is status post intravenous iron infusion on 2 separate occasions including July 2021.  Laboratory data from June 2020 showed hemoglobin around 12.3 with decreased saturation 17%.  Risks and benefits of repeat intravenous iron infusion in the future were discussed.  Complications that include arthralgias, myalgias and rarely anaphylaxis were reviewed.  Her hemoglobin at today does show decrease compared to laboratory testing 6 months previously.  We will await the results of iron studies and will arrange for IV iron infusion if she needs it.  In all likelihood she will require it and she is agreeable to proceed.  2.  Irritable bowel syndrome: She has symptoms of poor absorption  likely contributing to her iron deficiency.  She continues to follow with GI regarding this issue.  3.  Follow-up: In 6 months for repeat follow-up.  30   minutes were spent on this visit.  Time was dedicated to reviewing laboratory data, treatment options and outlining future plan of care.    Zola Button, MD 12/23/20212:15 PM

## 2020-12-08 NOTE — Addendum Note (Signed)
Addended by: Wilmon Arms on: 12/08/2020 03:44 PM   Modules accepted: Orders

## 2020-12-12 ENCOUNTER — Telehealth: Payer: Self-pay | Admitting: *Deleted

## 2020-12-12 LAB — IRON AND TIBC
Iron: 31 ug/dL — ABNORMAL LOW (ref 41–142)
Saturation Ratios: 8 % — ABNORMAL LOW (ref 21–57)
TIBC: 380 ug/dL (ref 236–444)
UIBC: 349 ug/dL (ref 120–384)

## 2020-12-12 LAB — FERRITIN: Ferritin: 13 ng/mL (ref 11–307)

## 2020-12-12 NOTE — Telephone Encounter (Signed)
-----   Message from Benjiman Core, MD sent at 12/12/2020 11:38 AM EST ----- Please let her know her iron is low. She will need IV iron and message to scheduling has been sent.

## 2020-12-12 NOTE — Telephone Encounter (Signed)
LM with note below 

## 2020-12-13 ENCOUNTER — Telehealth: Payer: Self-pay | Admitting: Oncology

## 2020-12-13 NOTE — Telephone Encounter (Signed)
Scheduled appt per 12/23 los - called pt - no answer , left message for patient with appt date and time

## 2020-12-16 ENCOUNTER — Other Ambulatory Visit: Payer: Self-pay

## 2020-12-16 ENCOUNTER — Inpatient Hospital Stay: Payer: Medicare HMO

## 2020-12-16 VITALS — BP 117/75 | HR 100 | Temp 97.4°F | Resp 17

## 2020-12-16 DIAGNOSIS — D509 Iron deficiency anemia, unspecified: Secondary | ICD-10-CM

## 2020-12-16 DIAGNOSIS — D508 Other iron deficiency anemias: Secondary | ICD-10-CM | POA: Diagnosis not present

## 2020-12-16 MED ORDER — SODIUM CHLORIDE 0.9 % IV SOLN
510.0000 mg | Freq: Once | INTRAVENOUS | Status: AC
  Administered 2020-12-16: 510 mg via INTRAVENOUS
  Filled 2020-12-16: qty 510

## 2020-12-16 MED ORDER — SODIUM CHLORIDE 0.9 % IV SOLN
Freq: Once | INTRAVENOUS | Status: AC
  Filled 2020-12-16: qty 250

## 2020-12-23 ENCOUNTER — Other Ambulatory Visit: Payer: Self-pay

## 2020-12-23 ENCOUNTER — Inpatient Hospital Stay: Payer: Medicare HMO | Attending: Oncology

## 2020-12-23 VITALS — BP 106/64 | HR 83 | Resp 16

## 2020-12-23 DIAGNOSIS — D508 Other iron deficiency anemias: Secondary | ICD-10-CM | POA: Insufficient documentation

## 2020-12-23 DIAGNOSIS — D509 Iron deficiency anemia, unspecified: Secondary | ICD-10-CM

## 2020-12-23 DIAGNOSIS — K909 Intestinal malabsorption, unspecified: Secondary | ICD-10-CM | POA: Insufficient documentation

## 2020-12-23 MED ORDER — SODIUM CHLORIDE 0.9 % IV SOLN
Freq: Once | INTRAVENOUS | Status: AC
  Filled 2020-12-23: qty 250

## 2020-12-23 MED ORDER — SODIUM CHLORIDE 0.9 % IV SOLN
510.0000 mg | Freq: Once | INTRAVENOUS | Status: AC
  Administered 2020-12-23: 510 mg via INTRAVENOUS
  Filled 2020-12-23: qty 510

## 2020-12-26 DIAGNOSIS — I10 Essential (primary) hypertension: Secondary | ICD-10-CM | POA: Diagnosis not present

## 2020-12-26 DIAGNOSIS — M069 Rheumatoid arthritis, unspecified: Secondary | ICD-10-CM | POA: Diagnosis not present

## 2020-12-26 DIAGNOSIS — D8989 Other specified disorders involving the immune mechanism, not elsewhere classified: Secondary | ICD-10-CM | POA: Diagnosis not present

## 2020-12-26 DIAGNOSIS — R69 Illness, unspecified: Secondary | ICD-10-CM | POA: Diagnosis not present

## 2020-12-26 DIAGNOSIS — Z0000 Encounter for general adult medical examination without abnormal findings: Secondary | ICD-10-CM | POA: Diagnosis not present

## 2020-12-26 DIAGNOSIS — E785 Hyperlipidemia, unspecified: Secondary | ICD-10-CM | POA: Diagnosis not present

## 2020-12-26 DIAGNOSIS — R5382 Chronic fatigue, unspecified: Secondary | ICD-10-CM | POA: Diagnosis not present

## 2020-12-26 DIAGNOSIS — G894 Chronic pain syndrome: Secondary | ICD-10-CM | POA: Diagnosis not present

## 2020-12-26 DIAGNOSIS — M3500 Sicca syndrome, unspecified: Secondary | ICD-10-CM | POA: Diagnosis not present

## 2020-12-26 DIAGNOSIS — N3010 Interstitial cystitis (chronic) without hematuria: Secondary | ICD-10-CM | POA: Diagnosis not present

## 2020-12-26 DIAGNOSIS — M352 Behcet's disease: Secondary | ICD-10-CM | POA: Diagnosis not present

## 2020-12-26 DIAGNOSIS — M797 Fibromyalgia: Secondary | ICD-10-CM | POA: Diagnosis not present

## 2021-01-11 DIAGNOSIS — M150 Primary generalized (osteo)arthritis: Secondary | ICD-10-CM | POA: Diagnosis not present

## 2021-01-11 DIAGNOSIS — Z124 Encounter for screening for malignant neoplasm of cervix: Secondary | ICD-10-CM | POA: Diagnosis not present

## 2021-01-11 DIAGNOSIS — M0609 Rheumatoid arthritis without rheumatoid factor, multiple sites: Secondary | ICD-10-CM | POA: Diagnosis not present

## 2021-01-11 DIAGNOSIS — M2550 Pain in unspecified joint: Secondary | ICD-10-CM | POA: Diagnosis not present

## 2021-01-11 DIAGNOSIS — R69 Illness, unspecified: Secondary | ICD-10-CM | POA: Diagnosis not present

## 2021-01-11 DIAGNOSIS — Z01419 Encounter for gynecological examination (general) (routine) without abnormal findings: Secondary | ICD-10-CM | POA: Diagnosis not present

## 2021-01-11 DIAGNOSIS — E663 Overweight: Secondary | ICD-10-CM | POA: Diagnosis not present

## 2021-01-11 DIAGNOSIS — M797 Fibromyalgia: Secondary | ICD-10-CM | POA: Diagnosis not present

## 2021-01-11 DIAGNOSIS — Z79899 Other long term (current) drug therapy: Secondary | ICD-10-CM | POA: Diagnosis not present

## 2021-01-11 DIAGNOSIS — Z6826 Body mass index (BMI) 26.0-26.9, adult: Secondary | ICD-10-CM | POA: Diagnosis not present

## 2021-01-11 DIAGNOSIS — R635 Abnormal weight gain: Secondary | ICD-10-CM | POA: Diagnosis not present

## 2021-01-11 DIAGNOSIS — E8770 Fluid overload, unspecified: Secondary | ICD-10-CM | POA: Diagnosis not present

## 2021-01-11 DIAGNOSIS — M3500 Sicca syndrome, unspecified: Secondary | ICD-10-CM | POA: Diagnosis not present

## 2021-01-17 DIAGNOSIS — J3089 Other allergic rhinitis: Secondary | ICD-10-CM | POA: Diagnosis not present

## 2021-01-17 DIAGNOSIS — J3081 Allergic rhinitis due to animal (cat) (dog) hair and dander: Secondary | ICD-10-CM | POA: Diagnosis not present

## 2021-01-17 DIAGNOSIS — J301 Allergic rhinitis due to pollen: Secondary | ICD-10-CM | POA: Diagnosis not present

## 2021-01-20 DIAGNOSIS — J301 Allergic rhinitis due to pollen: Secondary | ICD-10-CM | POA: Diagnosis not present

## 2021-01-20 DIAGNOSIS — J3089 Other allergic rhinitis: Secondary | ICD-10-CM | POA: Diagnosis not present

## 2021-01-20 DIAGNOSIS — J3081 Allergic rhinitis due to animal (cat) (dog) hair and dander: Secondary | ICD-10-CM | POA: Diagnosis not present

## 2021-02-08 DIAGNOSIS — E785 Hyperlipidemia, unspecified: Secondary | ICD-10-CM | POA: Diagnosis not present

## 2021-02-16 DIAGNOSIS — N3010 Interstitial cystitis (chronic) without hematuria: Secondary | ICD-10-CM | POA: Diagnosis not present

## 2021-02-17 DIAGNOSIS — J3081 Allergic rhinitis due to animal (cat) (dog) hair and dander: Secondary | ICD-10-CM | POA: Diagnosis not present

## 2021-02-17 DIAGNOSIS — J3089 Other allergic rhinitis: Secondary | ICD-10-CM | POA: Diagnosis not present

## 2021-02-17 DIAGNOSIS — J301 Allergic rhinitis due to pollen: Secondary | ICD-10-CM | POA: Diagnosis not present

## 2021-02-21 DIAGNOSIS — L508 Other urticaria: Secondary | ICD-10-CM | POA: Diagnosis not present

## 2021-02-21 DIAGNOSIS — R682 Dry mouth, unspecified: Secondary | ICD-10-CM | POA: Diagnosis not present

## 2021-02-21 DIAGNOSIS — G501 Atypical facial pain: Secondary | ICD-10-CM | POA: Diagnosis not present

## 2021-02-21 DIAGNOSIS — K1121 Acute sialoadenitis: Secondary | ICD-10-CM | POA: Diagnosis not present

## 2021-02-21 DIAGNOSIS — J302 Other seasonal allergic rhinitis: Secondary | ICD-10-CM | POA: Diagnosis not present

## 2021-02-24 DIAGNOSIS — J3081 Allergic rhinitis due to animal (cat) (dog) hair and dander: Secondary | ICD-10-CM | POA: Diagnosis not present

## 2021-02-24 DIAGNOSIS — J301 Allergic rhinitis due to pollen: Secondary | ICD-10-CM | POA: Diagnosis not present

## 2021-02-24 DIAGNOSIS — J3089 Other allergic rhinitis: Secondary | ICD-10-CM | POA: Diagnosis not present

## 2021-03-02 DIAGNOSIS — J3081 Allergic rhinitis due to animal (cat) (dog) hair and dander: Secondary | ICD-10-CM | POA: Diagnosis not present

## 2021-03-02 DIAGNOSIS — J301 Allergic rhinitis due to pollen: Secondary | ICD-10-CM | POA: Diagnosis not present

## 2021-03-02 DIAGNOSIS — J3089 Other allergic rhinitis: Secondary | ICD-10-CM | POA: Diagnosis not present

## 2021-03-02 DIAGNOSIS — Z20822 Contact with and (suspected) exposure to covid-19: Secondary | ICD-10-CM | POA: Diagnosis not present

## 2021-03-09 DIAGNOSIS — R109 Unspecified abdominal pain: Secondary | ICD-10-CM | POA: Diagnosis not present

## 2021-03-09 DIAGNOSIS — R3915 Urgency of urination: Secondary | ICD-10-CM | POA: Diagnosis not present

## 2021-03-09 DIAGNOSIS — M7918 Myalgia, other site: Secondary | ICD-10-CM | POA: Diagnosis not present

## 2021-03-09 DIAGNOSIS — N3941 Urge incontinence: Secondary | ICD-10-CM | POA: Diagnosis not present

## 2021-03-09 DIAGNOSIS — R350 Frequency of micturition: Secondary | ICD-10-CM | POA: Diagnosis not present

## 2021-03-09 DIAGNOSIS — N3010 Interstitial cystitis (chronic) without hematuria: Secondary | ICD-10-CM | POA: Diagnosis not present

## 2021-03-23 DIAGNOSIS — J3089 Other allergic rhinitis: Secondary | ICD-10-CM | POA: Diagnosis not present

## 2021-03-23 DIAGNOSIS — J301 Allergic rhinitis due to pollen: Secondary | ICD-10-CM | POA: Diagnosis not present

## 2021-03-23 DIAGNOSIS — J3081 Allergic rhinitis due to animal (cat) (dog) hair and dander: Secondary | ICD-10-CM | POA: Diagnosis not present

## 2021-03-29 DIAGNOSIS — R030 Elevated blood-pressure reading, without diagnosis of hypertension: Secondary | ICD-10-CM | POA: Diagnosis not present

## 2021-03-29 DIAGNOSIS — M47816 Spondylosis without myelopathy or radiculopathy, lumbar region: Secondary | ICD-10-CM | POA: Diagnosis not present

## 2021-03-29 DIAGNOSIS — M4722 Other spondylosis with radiculopathy, cervical region: Secondary | ICD-10-CM | POA: Diagnosis not present

## 2021-03-29 DIAGNOSIS — Z6826 Body mass index (BMI) 26.0-26.9, adult: Secondary | ICD-10-CM | POA: Diagnosis not present

## 2021-04-06 DIAGNOSIS — F411 Generalized anxiety disorder: Secondary | ICD-10-CM | POA: Diagnosis not present

## 2021-04-06 DIAGNOSIS — R69 Illness, unspecified: Secondary | ICD-10-CM | POA: Diagnosis not present

## 2021-04-06 DIAGNOSIS — G4700 Insomnia, unspecified: Secondary | ICD-10-CM | POA: Diagnosis not present

## 2021-04-10 DIAGNOSIS — J3089 Other allergic rhinitis: Secondary | ICD-10-CM | POA: Diagnosis not present

## 2021-04-10 DIAGNOSIS — J301 Allergic rhinitis due to pollen: Secondary | ICD-10-CM | POA: Diagnosis not present

## 2021-04-10 DIAGNOSIS — J3081 Allergic rhinitis due to animal (cat) (dog) hair and dander: Secondary | ICD-10-CM | POA: Diagnosis not present

## 2021-05-11 DIAGNOSIS — J301 Allergic rhinitis due to pollen: Secondary | ICD-10-CM | POA: Diagnosis not present

## 2021-05-11 DIAGNOSIS — J3089 Other allergic rhinitis: Secondary | ICD-10-CM | POA: Diagnosis not present

## 2021-05-11 DIAGNOSIS — J3081 Allergic rhinitis due to animal (cat) (dog) hair and dander: Secondary | ICD-10-CM | POA: Diagnosis not present

## 2021-05-19 DIAGNOSIS — J301 Allergic rhinitis due to pollen: Secondary | ICD-10-CM | POA: Diagnosis not present

## 2021-05-19 DIAGNOSIS — J3081 Allergic rhinitis due to animal (cat) (dog) hair and dander: Secondary | ICD-10-CM | POA: Diagnosis not present

## 2021-05-19 DIAGNOSIS — J3089 Other allergic rhinitis: Secondary | ICD-10-CM | POA: Diagnosis not present

## 2021-05-26 DIAGNOSIS — J3089 Other allergic rhinitis: Secondary | ICD-10-CM | POA: Diagnosis not present

## 2021-05-26 DIAGNOSIS — J301 Allergic rhinitis due to pollen: Secondary | ICD-10-CM | POA: Diagnosis not present

## 2021-05-26 DIAGNOSIS — J3081 Allergic rhinitis due to animal (cat) (dog) hair and dander: Secondary | ICD-10-CM | POA: Diagnosis not present

## 2021-05-30 DIAGNOSIS — M79662 Pain in left lower leg: Secondary | ICD-10-CM | POA: Diagnosis not present

## 2021-05-30 DIAGNOSIS — G5603 Carpal tunnel syndrome, bilateral upper limbs: Secondary | ICD-10-CM | POA: Diagnosis not present

## 2021-05-31 DIAGNOSIS — Z6827 Body mass index (BMI) 27.0-27.9, adult: Secondary | ICD-10-CM | POA: Diagnosis not present

## 2021-05-31 DIAGNOSIS — M797 Fibromyalgia: Secondary | ICD-10-CM | POA: Diagnosis not present

## 2021-05-31 DIAGNOSIS — M2550 Pain in unspecified joint: Secondary | ICD-10-CM | POA: Diagnosis not present

## 2021-05-31 DIAGNOSIS — Z79899 Other long term (current) drug therapy: Secondary | ICD-10-CM | POA: Diagnosis not present

## 2021-05-31 DIAGNOSIS — M150 Primary generalized (osteo)arthritis: Secondary | ICD-10-CM | POA: Diagnosis not present

## 2021-05-31 DIAGNOSIS — M0609 Rheumatoid arthritis without rheumatoid factor, multiple sites: Secondary | ICD-10-CM | POA: Diagnosis not present

## 2021-05-31 DIAGNOSIS — E663 Overweight: Secondary | ICD-10-CM | POA: Diagnosis not present

## 2021-05-31 DIAGNOSIS — M3500 Sicca syndrome, unspecified: Secondary | ICD-10-CM | POA: Diagnosis not present

## 2021-06-02 DIAGNOSIS — J3081 Allergic rhinitis due to animal (cat) (dog) hair and dander: Secondary | ICD-10-CM | POA: Diagnosis not present

## 2021-06-02 DIAGNOSIS — J3089 Other allergic rhinitis: Secondary | ICD-10-CM | POA: Diagnosis not present

## 2021-06-02 DIAGNOSIS — J301 Allergic rhinitis due to pollen: Secondary | ICD-10-CM | POA: Diagnosis not present

## 2021-06-08 ENCOUNTER — Inpatient Hospital Stay: Payer: Medicare HMO

## 2021-06-08 ENCOUNTER — Telehealth: Payer: Self-pay | Admitting: Oncology

## 2021-06-08 ENCOUNTER — Inpatient Hospital Stay: Payer: Medicare HMO | Admitting: Oncology

## 2021-06-08 DIAGNOSIS — G5603 Carpal tunnel syndrome, bilateral upper limbs: Secondary | ICD-10-CM | POA: Diagnosis not present

## 2021-06-08 NOTE — Telephone Encounter (Signed)
Scheduled appointment per 06/23 sch msg. Left message.

## 2021-06-09 DIAGNOSIS — J301 Allergic rhinitis due to pollen: Secondary | ICD-10-CM | POA: Diagnosis not present

## 2021-06-09 DIAGNOSIS — J3089 Other allergic rhinitis: Secondary | ICD-10-CM | POA: Diagnosis not present

## 2021-06-09 DIAGNOSIS — J3081 Allergic rhinitis due to animal (cat) (dog) hair and dander: Secondary | ICD-10-CM | POA: Diagnosis not present

## 2021-06-16 DIAGNOSIS — J301 Allergic rhinitis due to pollen: Secondary | ICD-10-CM | POA: Diagnosis not present

## 2021-06-16 DIAGNOSIS — J3089 Other allergic rhinitis: Secondary | ICD-10-CM | POA: Diagnosis not present

## 2021-06-16 DIAGNOSIS — J3081 Allergic rhinitis due to animal (cat) (dog) hair and dander: Secondary | ICD-10-CM | POA: Diagnosis not present

## 2021-06-26 DIAGNOSIS — M069 Rheumatoid arthritis, unspecified: Secondary | ICD-10-CM | POA: Diagnosis not present

## 2021-06-26 DIAGNOSIS — I10 Essential (primary) hypertension: Secondary | ICD-10-CM | POA: Diagnosis not present

## 2021-06-26 DIAGNOSIS — M352 Behcet's disease: Secondary | ICD-10-CM | POA: Diagnosis not present

## 2021-06-26 DIAGNOSIS — N3010 Interstitial cystitis (chronic) without hematuria: Secondary | ICD-10-CM | POA: Diagnosis not present

## 2021-06-26 DIAGNOSIS — R69 Illness, unspecified: Secondary | ICD-10-CM | POA: Diagnosis not present

## 2021-06-26 DIAGNOSIS — M797 Fibromyalgia: Secondary | ICD-10-CM | POA: Diagnosis not present

## 2021-06-26 DIAGNOSIS — G894 Chronic pain syndrome: Secondary | ICD-10-CM | POA: Diagnosis not present

## 2021-06-26 DIAGNOSIS — M3500 Sicca syndrome, unspecified: Secondary | ICD-10-CM | POA: Diagnosis not present

## 2021-06-26 DIAGNOSIS — D509 Iron deficiency anemia, unspecified: Secondary | ICD-10-CM | POA: Diagnosis not present

## 2021-06-26 DIAGNOSIS — D8989 Other specified disorders involving the immune mechanism, not elsewhere classified: Secondary | ICD-10-CM | POA: Diagnosis not present

## 2021-06-28 ENCOUNTER — Telehealth: Payer: Self-pay | Admitting: Oncology

## 2021-06-28 ENCOUNTER — Inpatient Hospital Stay: Payer: Medicare HMO | Admitting: Oncology

## 2021-06-28 ENCOUNTER — Inpatient Hospital Stay: Payer: Medicare HMO

## 2021-06-28 NOTE — Telephone Encounter (Signed)
R/s per pt request 7/13 , left msg

## 2021-06-30 DIAGNOSIS — J301 Allergic rhinitis due to pollen: Secondary | ICD-10-CM | POA: Diagnosis not present

## 2021-06-30 DIAGNOSIS — J3089 Other allergic rhinitis: Secondary | ICD-10-CM | POA: Diagnosis not present

## 2021-06-30 DIAGNOSIS — J3081 Allergic rhinitis due to animal (cat) (dog) hair and dander: Secondary | ICD-10-CM | POA: Diagnosis not present

## 2021-07-06 ENCOUNTER — Inpatient Hospital Stay: Payer: Medicare HMO | Admitting: Oncology

## 2021-07-06 ENCOUNTER — Telehealth: Payer: Self-pay | Admitting: *Deleted

## 2021-07-06 ENCOUNTER — Inpatient Hospital Stay: Payer: Medicare HMO | Attending: Internal Medicine

## 2021-07-06 DIAGNOSIS — F411 Generalized anxiety disorder: Secondary | ICD-10-CM | POA: Diagnosis not present

## 2021-07-06 DIAGNOSIS — R339 Retention of urine, unspecified: Secondary | ICD-10-CM | POA: Diagnosis not present

## 2021-07-06 DIAGNOSIS — R69 Illness, unspecified: Secondary | ICD-10-CM | POA: Diagnosis not present

## 2021-07-06 NOTE — Telephone Encounter (Signed)
PC to patient, no answer, left VM about missed appointments today.  Instructed patient to call scheduling department to reschedule.  [phone]

## 2021-07-07 DIAGNOSIS — J301 Allergic rhinitis due to pollen: Secondary | ICD-10-CM | POA: Diagnosis not present

## 2021-07-07 DIAGNOSIS — J3089 Other allergic rhinitis: Secondary | ICD-10-CM | POA: Diagnosis not present

## 2021-07-07 DIAGNOSIS — J3081 Allergic rhinitis due to animal (cat) (dog) hair and dander: Secondary | ICD-10-CM | POA: Diagnosis not present

## 2021-07-14 DIAGNOSIS — J3089 Other allergic rhinitis: Secondary | ICD-10-CM | POA: Diagnosis not present

## 2021-07-14 DIAGNOSIS — J3081 Allergic rhinitis due to animal (cat) (dog) hair and dander: Secondary | ICD-10-CM | POA: Diagnosis not present

## 2021-07-14 DIAGNOSIS — J301 Allergic rhinitis due to pollen: Secondary | ICD-10-CM | POA: Diagnosis not present

## 2021-07-21 DIAGNOSIS — J301 Allergic rhinitis due to pollen: Secondary | ICD-10-CM | POA: Diagnosis not present

## 2021-07-21 DIAGNOSIS — J3081 Allergic rhinitis due to animal (cat) (dog) hair and dander: Secondary | ICD-10-CM | POA: Diagnosis not present

## 2021-07-21 DIAGNOSIS — J3089 Other allergic rhinitis: Secondary | ICD-10-CM | POA: Diagnosis not present

## 2021-08-04 ENCOUNTER — Other Ambulatory Visit: Payer: Self-pay

## 2021-08-04 ENCOUNTER — Inpatient Hospital Stay: Payer: Medicare HMO

## 2021-08-04 ENCOUNTER — Inpatient Hospital Stay: Payer: Medicare HMO | Attending: Internal Medicine | Admitting: Oncology

## 2021-08-04 DIAGNOSIS — D508 Other iron deficiency anemias: Secondary | ICD-10-CM | POA: Insufficient documentation

## 2021-08-04 DIAGNOSIS — J3081 Allergic rhinitis due to animal (cat) (dog) hair and dander: Secondary | ICD-10-CM | POA: Diagnosis not present

## 2021-08-04 DIAGNOSIS — J301 Allergic rhinitis due to pollen: Secondary | ICD-10-CM | POA: Diagnosis not present

## 2021-08-04 DIAGNOSIS — D509 Iron deficiency anemia, unspecified: Secondary | ICD-10-CM | POA: Diagnosis not present

## 2021-08-04 DIAGNOSIS — K909 Intestinal malabsorption, unspecified: Secondary | ICD-10-CM | POA: Insufficient documentation

## 2021-08-04 DIAGNOSIS — J3089 Other allergic rhinitis: Secondary | ICD-10-CM | POA: Diagnosis not present

## 2021-08-04 LAB — CBC WITH DIFFERENTIAL (CANCER CENTER ONLY)
Abs Immature Granulocytes: 0.02 10*3/uL (ref 0.00–0.07)
Basophils Absolute: 0 10*3/uL (ref 0.0–0.1)
Basophils Relative: 1 %
Eosinophils Absolute: 0.1 10*3/uL (ref 0.0–0.5)
Eosinophils Relative: 2 %
HCT: 37.5 % (ref 36.0–46.0)
Hemoglobin: 12.5 g/dL (ref 12.0–15.0)
Immature Granulocytes: 0 %
Lymphocytes Relative: 31 %
Lymphs Abs: 2 10*3/uL (ref 0.7–4.0)
MCH: 28.2 pg (ref 26.0–34.0)
MCHC: 33.3 g/dL (ref 30.0–36.0)
MCV: 84.7 fL (ref 80.0–100.0)
Monocytes Absolute: 0.5 10*3/uL (ref 0.1–1.0)
Monocytes Relative: 8 %
Neutro Abs: 3.8 10*3/uL (ref 1.7–7.7)
Neutrophils Relative %: 58 %
Platelet Count: 342 10*3/uL (ref 150–400)
RBC: 4.43 MIL/uL (ref 3.87–5.11)
RDW: 14.7 % (ref 11.5–15.5)
WBC Count: 6.4 10*3/uL (ref 4.0–10.5)
nRBC: 0 % (ref 0.0–0.2)

## 2021-08-04 NOTE — Progress Notes (Signed)
Hematology and Oncology Follow Up Visit  Kelly Garcia [id number] 02-May-1966 55 y.o. 08/04/2021 3:20 PM [name], MDRusso, [name], MD   Principle Diagnosis: 55 year old woman with recurrent iron deficiency anemia related to poor iron absorption diagnosed in 2020.     Prior Therapy:  She is status post a Feraheme infusion completed in September 2020.  This was repeated in July 2021.  Her last infusion was given in January 2022.     Current therapy: Repeat intravenous iron as needed.    History of present illness: [name] presents today for repeat evaluation.  Since the last visit, she reports no major complaints but does report periodic fatigue and tiredness.  He did receive intravenous iron in January of this year with improvement in her performance status and activity level.  She has reported increased fatigue recently.  She denies any hematochezia, melena or hemoptysis.    Medications: Reviewed and updated. Current Outpatient Medications  Medication Sig Dispense Refill   Biotin 1 MG CAPS Take by mouth.     FLUoxetine (PROZAC) 20 MG tablet Take 20 mg by mouth daily. 3 tabs a day     hydrOXYzine (ATARAX/VISTARIL) 25 MG tablet Take 25 mg by mouth 3 (three) times daily as needed.     Multiple Vitamin (MULTIVITAMIN) tablet Take 1 tablet by mouth daily. (Patient not taking: Reported on 06/08/2020)     Omega-3 Fatty Acids (FISH OIL) 1000 MG CAPS Take by mouth.     oxyCODONE-acetaminophen (PERCOCET/ROXICET) 5-325 MG tablet Take 1-2 tablets by mouth every 4 (four) hours as needed for severe pain.     oxyCODONE-acetaminophen (ROXICET) 5-325 MG tablet Take 1 tablet by mouth every 6 (six) hours as needed for severe pain. 20 tablet 0   phenazopyridine (PYRIDIUM) 100 MG tablet Take 100 mg by mouth 3 (three) times daily as needed. (Patient not taking: Reported on 06/08/2020)     promethazine (PHENERGAN) 25 MG tablet Take 1 mg by mouth every 8 (eight) hours as needed for nausea or vomiting.  (Patient not taking: Reported on 06/08/2020)     Red Yeast Rice Extract (RED YEAST RICE PO) Take by mouth. (Patient not taking: Reported on 06/08/2020)     vitamin E 100 UNIT capsule Take by mouth daily.     No current facility-administered medications for this visit.     Allergies:  Allergies  Allergen Reactions   Benzoin     blister   Cephalexin    Codeine Nausea And Vomiting   Doxycycline Itching   Morphine And Related Itching   Sulfa Antibiotics Hives    .    Physical Exam:      ECOG: 0   General appearance: Alert, awake without any distress. Head: Atraumatic without abnormalities Oropharynx: Without any thrush or ulcers. Eyes: No scleral icterus. Lymph nodes: No lymphadenopathy noted in the cervical, supraclavicular, or axillary nodes Heart:regular rate and rhythm, without any murmurs or gallops.   Lung: Clear to auscultation without any rhonchi, wheezes or dullness to percussion. Abdomin: Soft, nontender without any shifting dullness or ascites. Musculoskeletal: No clubbing or cyanosis. Neurological: No motor or sensory deficits. Skin: No rashes or lesions.        Lab Results: Lab Results  Component Value Date   WBC 6.6 12/08/2020   HGB 10.2 (L) 12/08/2020   HCT 32.0 (L) 12/08/2020   MCV 84.4 12/08/2020   PLT 334 12/08/2020     Chemistry      Component Value Date/Time   NA 139  03/15/2008 1837   K 3.7 03/15/2008 1837   CL 106 03/15/2008 1837   CO2 23 03/15/2008 1837   BUN 13 03/15/2008 1837   CREATININE 0.71 03/15/2008 1837      Component Value Date/Time   CALCIUM 9.8 03/15/2008 1837   ALKPHOS 54 03/15/2008 1837   AST 41 (H) 03/15/2008 1837   ALT 39 (H) 03/15/2008 1837   BILITOT 0.5 03/15/2008 1837       Results for KELLAN, DESHOTEL (MRN [id number]) as of 08/04/2021 15:27  Ref. Range 12/08/2020 15:09  Iron Latest Ref Range: 41 - 142 ug/dL 31 (L)  UIBC Latest Ref Range: 120 - 384 ug/dL 349  TIBC Latest Ref Range: 236 - 444 ug/dL 380   Saturation Ratios Latest Ref Range: 21 - 57 % 8 (L)  Ferritin Latest Ref Range: 11 - 307 ng/mL 13     Impression and Plan:   55 year old woman with:  1.  Iron deficiency anemia diagnosed in 2020.  This is related to poor oral iron absorption.  Treatment options were discussed at this time and laboratory data were reviewed.  Iron studies in December were low with a hemoglobin of 10.2 and received intravenous iron after that.  Laboratory data from today is currently pending and pending these results we will determine best course of action.  Repeat intravenous iron option was discussed at this time.  Complications including arthralgias, myalgias and infusion related issues were reviewed.  She is agreeable to proceed if needed.   2.  Follow-up: She will return in 6 months for repeat follow-up  20   minutes were dedicated to this encounter.  Time spent on reviewing laboratory data, disease status update, treatment choices and complications noted therapy.    Zola Button, MD 8/19/20223:20 PM

## 2021-08-07 ENCOUNTER — Telehealth: Payer: Self-pay | Admitting: *Deleted

## 2021-08-07 LAB — IRON AND TIBC
Iron: 77 ug/dL (ref 41–142)
Saturation Ratios: 19 % — ABNORMAL LOW (ref 21–57)
TIBC: 405 ug/dL (ref 236–444)
UIBC: 328 ug/dL (ref 120–384)

## 2021-08-07 LAB — FERRITIN: Ferritin: 45 ng/mL (ref 11–307)

## 2021-08-07 NOTE — Telephone Encounter (Signed)
Called to relay message about labs and had to leave a message.    Advised to call back if she has any further questions.

## 2021-08-07 NOTE — Telephone Encounter (Signed)
-----   Message from [name], MD sent at 08/07/2021  9:13 AM EDT ----- Please let her know her iron and ferritin is normal and does not require any IV iron infusion at this time.

## 2021-08-15 DIAGNOSIS — Z79891 Long term (current) use of opiate analgesic: Secondary | ICD-10-CM | POA: Diagnosis not present

## 2021-08-15 DIAGNOSIS — Z7982 Long term (current) use of aspirin: Secondary | ICD-10-CM | POA: Diagnosis not present

## 2021-08-15 DIAGNOSIS — U071 COVID-19: Secondary | ICD-10-CM | POA: Diagnosis not present

## 2021-08-20 DIAGNOSIS — R059 Cough, unspecified: Secondary | ICD-10-CM | POA: Diagnosis not present

## 2021-08-20 DIAGNOSIS — U071 COVID-19: Secondary | ICD-10-CM | POA: Diagnosis not present

## 2021-08-20 DIAGNOSIS — R0602 Shortness of breath: Secondary | ICD-10-CM | POA: Diagnosis not present

## 2021-09-04 DIAGNOSIS — J22 Unspecified acute lower respiratory infection: Secondary | ICD-10-CM | POA: Diagnosis not present

## 2021-10-05 DIAGNOSIS — F418 Other specified anxiety disorders: Secondary | ICD-10-CM | POA: Diagnosis not present

## 2021-10-05 DIAGNOSIS — F411 Generalized anxiety disorder: Secondary | ICD-10-CM | POA: Diagnosis not present

## 2021-10-05 DIAGNOSIS — R69 Illness, unspecified: Secondary | ICD-10-CM | POA: Diagnosis not present

## 2021-11-06 ENCOUNTER — Other Ambulatory Visit: Payer: Self-pay | Admitting: Obstetrics and Gynecology

## 2021-11-06 DIAGNOSIS — Z1231 Encounter for screening mammogram for malignant neoplasm of breast: Secondary | ICD-10-CM

## 2021-11-20 DIAGNOSIS — M0609 Rheumatoid arthritis without rheumatoid factor, multiple sites: Secondary | ICD-10-CM | POA: Diagnosis not present

## 2021-11-20 DIAGNOSIS — Z79899 Other long term (current) drug therapy: Secondary | ICD-10-CM | POA: Diagnosis not present

## 2021-11-23 DIAGNOSIS — R3915 Urgency of urination: Secondary | ICD-10-CM | POA: Diagnosis not present

## 2021-11-23 DIAGNOSIS — N3010 Interstitial cystitis (chronic) without hematuria: Secondary | ICD-10-CM | POA: Diagnosis not present

## 2021-11-23 DIAGNOSIS — M7918 Myalgia, other site: Secondary | ICD-10-CM | POA: Diagnosis not present

## 2021-11-23 DIAGNOSIS — M069 Rheumatoid arthritis, unspecified: Secondary | ICD-10-CM | POA: Diagnosis not present

## 2021-11-23 DIAGNOSIS — N135 Crossing vessel and stricture of ureter without hydronephrosis: Secondary | ICD-10-CM | POA: Diagnosis not present

## 2021-11-23 DIAGNOSIS — E785 Hyperlipidemia, unspecified: Secondary | ICD-10-CM | POA: Diagnosis not present

## 2021-11-23 DIAGNOSIS — R69 Illness, unspecified: Secondary | ICD-10-CM | POA: Diagnosis not present

## 2021-11-23 DIAGNOSIS — K760 Fatty (change of) liver, not elsewhere classified: Secondary | ICD-10-CM | POA: Diagnosis not present

## 2021-11-23 DIAGNOSIS — K580 Irritable bowel syndrome with diarrhea: Secondary | ICD-10-CM | POA: Diagnosis not present

## 2021-11-23 DIAGNOSIS — R350 Frequency of micturition: Secondary | ICD-10-CM | POA: Diagnosis not present

## 2021-12-07 DIAGNOSIS — G4700 Insomnia, unspecified: Secondary | ICD-10-CM | POA: Diagnosis not present

## 2021-12-07 DIAGNOSIS — R69 Illness, unspecified: Secondary | ICD-10-CM | POA: Diagnosis not present

## 2021-12-15 ENCOUNTER — Ambulatory Visit
Admission: RE | Admit: 2021-12-15 | Discharge: 2021-12-15 | Disposition: A | Discharge status: Home / Self Care | Payer: Medicare HMO | Source: Ambulatory Visit | Attending: Obstetrics and Gynecology | Admitting: Obstetrics and Gynecology

## 2021-12-15 ENCOUNTER — Encounter: Payer: Self-pay | Admitting: Oncology

## 2021-12-15 DIAGNOSIS — Z1231 Encounter for screening mammogram for malignant neoplasm of breast: Secondary | ICD-10-CM

## 2021-12-15 DIAGNOSIS — Z0100 Encounter for examination of eyes and vision without abnormal findings: Secondary | ICD-10-CM | POA: Diagnosis not present

## 2022-01-22 ENCOUNTER — Telehealth: Payer: Self-pay | Admitting: Oncology

## 2022-01-22 NOTE — Telephone Encounter (Signed)
Rescheduled 02/17 appointment due to provider pal, called and left a voicemail regarding new appointment.

## 2022-02-02 ENCOUNTER — Other Ambulatory Visit: Payer: Medicare HMO

## 2022-02-02 ENCOUNTER — Ambulatory Visit: Payer: Medicare HMO | Admitting: Oncology

## 2022-03-07 ENCOUNTER — Encounter: Payer: Self-pay | Admitting: Oncology

## 2022-03-12 ENCOUNTER — Telehealth: Payer: Self-pay | Admitting: Oncology

## 2022-03-12 NOTE — Telephone Encounter (Signed)
Scheduled appointment per inbasket message. Patient aware.   ?

## 2022-03-14 ENCOUNTER — Ambulatory Visit: Payer: Medicare HMO | Admitting: Oncology

## 2022-03-14 ENCOUNTER — Other Ambulatory Visit: Payer: Medicare HMO

## 2022-03-29 ENCOUNTER — Ambulatory Visit: Payer: Medicare HMO | Admitting: Oncology

## 2022-03-29 ENCOUNTER — Other Ambulatory Visit: Payer: Medicare HMO

## 2022-04-13 ENCOUNTER — Telehealth: Payer: Self-pay | Admitting: Oncology

## 2022-04-13 NOTE — Telephone Encounter (Signed)
.  Called patient to schedule appointment per 4/27 inbasket msg, patient is aware of date and time.   ?

## 2022-04-17 ENCOUNTER — Telehealth: Payer: Self-pay | Admitting: Oncology

## 2022-04-17 NOTE — Telephone Encounter (Signed)
.  Called patient to schedule appointment per 5/1 inbasket, left pt msg ?

## 2022-04-18 ENCOUNTER — Other Ambulatory Visit: Payer: Medicare HMO

## 2022-04-18 ENCOUNTER — Ambulatory Visit: Payer: Medicare HMO | Admitting: Oncology

## 2022-05-16 ENCOUNTER — Other Ambulatory Visit: Payer: Medicare HMO

## 2022-05-16 ENCOUNTER — Telehealth: Payer: Self-pay | Admitting: *Deleted

## 2022-05-16 ENCOUNTER — Ambulatory Visit: Payer: Medicare HMO | Admitting: Oncology

## 2022-05-16 NOTE — Telephone Encounter (Signed)
Lab orders for CBC, Ferritin, Iron & TIBC faxed to [facility] in [place], [place], fax confirmation received.  Instructed [facility] to fax results to this office.  Previously called [facility] for specific instructions regarding these orders, left VM x 3 with no return call.  PC to patient, informed her of situation, she will go to [place] for blood draw, she verbalizes understanding.

## 2022-05-16 NOTE — Telephone Encounter (Signed)
-----   Message from [name], MD sent at 05/15/2022  1:17 PM EDT ----- Yes ----- Message ----- From: Rolene Course, RN Sent: 05/15/2022   1:10 PM EDT To: [name], MD  She lives in [place] & is asking if she can have labs drawn at [facility] there.  ----- Message ----- From: [name], MD Sent: 05/15/2022  11:43 AM EDT To: Rolene Course, RN  She can have labs done this week and will arrange for iron accordingly ----- Message ----- From: Rolene Course, RN Sent: 05/15/2022  11:28 AM EDT To: [name], MD  This patient called, said she is very weak & fatigued, SOB & says her heart is racing with very little exertion.  She had labs done at her rheumatologist in April, they faxed a copy but it is only a CBC.  She has an appointment with you on 6/7 but feels she needs iron sooner than that.  Please advise.  Thanks, Bethena Roys

## 2022-05-18 ENCOUNTER — Telehealth: Payer: Self-pay | Admitting: *Deleted

## 2022-05-18 NOTE — Telephone Encounter (Signed)
Pt states she has seen her labs from [facility]. She says her gums are white, heart is racing and she has been SOB. Is requesting an appt for IV iron. Message to scheduler to add IV iron appt after she sees Dr Alen Blew on 6/7 (3:45). Pt comes to [facility] from [place].  Instructed to go the the local ED if needed.

## 2022-05-22 ENCOUNTER — Telehealth: Payer: Self-pay | Admitting: Oncology

## 2022-05-22 NOTE — Telephone Encounter (Signed)
.  Called pt per 6/3 inabsket , Patient was unavailable, a message with appt time and date was left with number on file.

## 2022-05-23 ENCOUNTER — Inpatient Hospital Stay: Payer: Medicare Other | Attending: Oncology | Admitting: Oncology

## 2022-05-23 ENCOUNTER — Other Ambulatory Visit: Payer: Self-pay | Admitting: Oncology

## 2022-05-23 ENCOUNTER — Other Ambulatory Visit: Payer: Medicare HMO

## 2022-05-23 ENCOUNTER — Other Ambulatory Visit: Payer: Self-pay

## 2022-05-23 VITALS — BP 126/85 | HR 99 | Temp 97.3°F | Resp 17 | Ht 62.0 in | Wt 136.0 lb

## 2022-05-23 DIAGNOSIS — K909 Intestinal malabsorption, unspecified: Secondary | ICD-10-CM | POA: Insufficient documentation

## 2022-05-23 DIAGNOSIS — D508 Other iron deficiency anemias: Secondary | ICD-10-CM | POA: Diagnosis present

## 2022-05-23 DIAGNOSIS — D509 Iron deficiency anemia, unspecified: Secondary | ICD-10-CM

## 2022-05-23 DIAGNOSIS — K1379 Other lesions of oral mucosa: Secondary | ICD-10-CM | POA: Insufficient documentation

## 2022-05-23 MED ORDER — LIDOCAINE VISCOUS HCL 2 % MT SOLN: 15.0000 mL | OROMUCOSAL | 0 refills | Status: DC | PRN

## 2022-05-23 NOTE — Progress Notes (Signed)
Hematology and Oncology Follow Up Visit  Kelly Garcia [id number] 08/08/1966 56 y.o. 05/23/2022 3:39 PM [name], MDRusso, [name], MD   Principle Diagnosis: 56 year old woman with iron deficiency anemia related to poor oral absorption diagnosed in 2020.     Prior Therapy:  She is status post a Feraheme infusion on multiple occasions with the last infusion given in January 2022.     Current therapy: Under evaluation for repeat iron infusion.    History of present illness: [name] returns today for a follow-up visit.  Since the last visit, she has started the reporting symptoms of fatigue tiredness and occasional dyspnea.  Laboratory testing obtained on May 17, 2022 showed a hemoglobin of 8.9 with MCV of 76.  White cell count was 8.0 and have an elevated platelet count of 491.  Iron studies showed iron level of 14 and saturation of 3%.  She denies any hematochezia, melena or hemoptysis.  She reports eating normal diet which she has been doing in the past.  Updated on review. Current Outpatient Medications  Medication Sig Dispense Refill   Biotin 1 MG CAPS Take by mouth.     FLUoxetine (PROZAC) 20 MG tablet Take 20 mg by mouth daily. 3 tabs a day     hydrOXYzine (ATARAX/VISTARIL) 25 MG tablet Take 25 mg by mouth 3 (three) times daily as needed.     Multiple Vitamin (MULTIVITAMIN) tablet Take 1 tablet by mouth daily. (Patient not taking: Reported on 06/08/2020)     Omega-3 Fatty Acids (FISH OIL) 1000 MG CAPS Take by mouth.     oxyCODONE-acetaminophen (PERCOCET/ROXICET) 5-325 MG tablet Take 1-2 tablets by mouth every 4 (four) hours as needed for severe pain.     oxyCODONE-acetaminophen (ROXICET) 5-325 MG tablet Take 1 tablet by mouth every 6 (six) hours as needed for severe pain. 20 tablet 0   phenazopyridine (PYRIDIUM) 100 MG tablet Take 100 mg by mouth 3 (three) times daily as needed. (Patient not taking: Reported on 06/08/2020)     promethazine (PHENERGAN) 25 MG tablet Take 1 mg by  mouth every 8 (eight) hours as needed for nausea or vomiting. (Patient not taking: Reported on 06/08/2020)     Red Yeast Rice Extract (RED YEAST RICE PO) Take by mouth. (Patient not taking: Reported on 06/08/2020)     vitamin E 100 UNIT capsule Take by mouth daily.     No current facility-administered medications for this visit.     Allergies:  Allergies  Allergen Reactions   Benzoin     blister   Cephalexin    Codeine Nausea And Vomiting   Doxycycline Itching   Morphine And Related Itching   Sulfa Antibiotics Hives    .    Physical Exam:    Blood pressure 126/85, pulse 99, temperature (!) 97.3 F (36.3 C), temperature source Temporal, resp. rate 17, height '5\' 2"'$  (1.575 m), weight 136 lb (61.7 kg), SpO2 100 %.   ECOG: 0    General appearance: Comfortable appearing without any discomfort Head: Normocephalic without any trauma Oropharynx: Mucous membranes are moist and pink without any thrush or ulcers. Eyes: Pupils are equal and round reactive to light. Lymph nodes: No cervical, supraclavicular, inguinal or axillary lymphadenopathy.   Heart:regular rate and rhythm.  S1 and S2 without leg edema. Lung: Clear without any rhonchi or wheezes.  No dullness to percussion. Abdomin: Soft, nontender, nondistended with good bowel sounds.  No hepatosplenomegaly. Musculoskeletal: No joint deformity or effusion.  Full range of motion noted.  Neurological: No deficits noted on motor, sensory and deep tendon reflex exam. Skin: No petechial rash or dryness.  Appeared moist.  Psychiatric: Mood and affect appeared appropriate.         Lab Results: Lab Results  Component Value Date   WBC 6.4 08/04/2021   HGB 12.5 08/04/2021   HCT 37.5 08/04/2021   MCV 84.7 08/04/2021   PLT 342 08/04/2021     Chemistry      Component Value Date/Time   NA 139 03/15/2008 1837   K 3.7 03/15/2008 1837   CL 106 03/15/2008 1837   CO2 23 03/15/2008 1837   BUN 13 03/15/2008 1837   CREATININE  0.71 03/15/2008 1837      Component Value Date/Time   CALCIUM 9.8 03/15/2008 1837   ALKPHOS 54 03/15/2008 1837   AST 41 (H) 03/15/2008 1837   ALT 39 (H) 03/15/2008 1837   BILITOT 0.5 03/15/2008 1837          Impression and Plan:   57 year old woman with:  1.  Iron deficiency anemia related to poor iron absorption diagnosed in 2020.    Laboratory data obtained on May 17, 2022 were reviewed and discussed with the patient and showed recurrence of her iron deficiency.  Risks and benefits of repeat intravenous iron were just gust.  Complications that include arthralgias, myalgias and rarely anaphylaxis were discussed.  She is agreeable to proceed at this time.  I recommended that continued monitoring with laboratory data every 3 to 6 months repeat IV iron as needed.  2.  Mouth pain: Unclear etiology but could be related to vitamin deficiency.  I recommended Magic mouthwash.   3.  Follow-up: In 9 months for repeat follow-up.  30   minutes were spent on this visit.  The time was dedicated to reviewing laboratory data, disease status update and discussing treatment choices and future plan of care discussion.    Zola Button, MD 6/7/20233:39 PM

## 2022-05-24 ENCOUNTER — Inpatient Hospital Stay: Payer: Medicare Other

## 2022-05-24 VITALS — BP 117/64 | HR 79 | Temp 97.8°F | Resp 17

## 2022-05-24 DIAGNOSIS — D509 Iron deficiency anemia, unspecified: Secondary | ICD-10-CM

## 2022-05-24 DIAGNOSIS — D508 Other iron deficiency anemias: Secondary | ICD-10-CM | POA: Diagnosis not present

## 2022-05-24 MED ORDER — SODIUM CHLORIDE 0.9 % IV SOLN: Freq: Once | INTRAVENOUS | Status: AC

## 2022-05-24 MED ORDER — SODIUM CHLORIDE 0.9 % IV SOLN
510.0000 mg | Freq: Once | INTRAVENOUS | Status: AC
  Administered 2022-05-24: 510 mg via INTRAVENOUS
  Filled 2022-05-24: qty 510

## 2022-05-24 NOTE — Progress Notes (Signed)
Patient tolerated IV iron infusion well. Declined to stay for 30 minute post observation period. VSS, ambulatory to lobby.  

## 2022-05-24 NOTE — Patient Instructions (Signed)

## 2022-05-31 ENCOUNTER — Other Ambulatory Visit: Payer: Self-pay

## 2022-05-31 ENCOUNTER — Inpatient Hospital Stay: Payer: Medicare Other

## 2022-05-31 VITALS — BP 141/82 | HR 73 | Temp 98.1°F | Resp 16

## 2022-05-31 DIAGNOSIS — D508 Other iron deficiency anemias: Secondary | ICD-10-CM | POA: Diagnosis not present

## 2022-05-31 DIAGNOSIS — D509 Iron deficiency anemia, unspecified: Secondary | ICD-10-CM

## 2022-05-31 MED ORDER — SODIUM CHLORIDE 0.9 % IV SOLN
510.0000 mg | Freq: Once | INTRAVENOUS | Status: AC
  Administered 2022-05-31: 510 mg via INTRAVENOUS
  Filled 2022-05-31: qty 510

## 2022-05-31 MED ORDER — SODIUM CHLORIDE 0.9 % IV SOLN: Freq: Once | INTRAVENOUS | Status: AC

## 2022-05-31 NOTE — Progress Notes (Signed)
Pt observed for 10 minutes post Feraheme infusion. Pt tolerated trtmt well w/out incident. VSS at discharge.  Ambulatory to lobby.

## 2022-05-31 NOTE — Patient Instructions (Signed)

## 2022-06-04 ENCOUNTER — Telehealth: Payer: Self-pay

## 2022-06-04 NOTE — Telephone Encounter (Signed)
T/C from pt stating even after having her infusion she is still severely fatigued and SOB. Panting after walking across the room.  No chest pain but her arms and legs feel heavy and her insomnia is worse.    She does not feel well and is asking if she may need another iron infusion since her symptoms have not improved at all.  Please advise

## 2022-06-04 NOTE — Telephone Encounter (Signed)
Pt advised and voiced understanding.   

## 2022-11-22 ENCOUNTER — Other Ambulatory Visit: Payer: Self-pay | Admitting: Obstetrics and Gynecology

## 2022-11-22 DIAGNOSIS — Z1231 Encounter for screening mammogram for malignant neoplasm of breast: Secondary | ICD-10-CM

## 2023-01-17 ENCOUNTER — Ambulatory Visit
Admission: RE | Admit: 2023-01-17 | Discharge: 2023-01-17 | Disposition: A | Discharge status: Home / Self Care | Payer: Medicare Other | Source: Ambulatory Visit | Attending: Obstetrics and Gynecology | Admitting: Obstetrics and Gynecology

## 2023-01-17 DIAGNOSIS — Z1231 Encounter for screening mammogram for malignant neoplasm of breast: Secondary | ICD-10-CM

## 2023-01-21 ENCOUNTER — Other Ambulatory Visit: Payer: Self-pay | Admitting: Obstetrics and Gynecology

## 2023-01-21 DIAGNOSIS — N6332 Unspecified lump in axillary tail of the left breast: Secondary | ICD-10-CM

## 2023-02-13 ENCOUNTER — Telehealth: Payer: Self-pay | Admitting: Physician Assistant

## 2023-02-13 NOTE — Telephone Encounter (Signed)
Patient called to reschedule appointment + Lab with [facility] Hodgkins, call dropped.

## 2023-02-19 ENCOUNTER — Ambulatory Visit
Admission: RE | Admit: 2023-02-19 | Discharge: 2023-02-19 | Disposition: A | Discharge status: Home / Self Care | Payer: Medicare Other | Source: Ambulatory Visit | Attending: Obstetrics and Gynecology | Admitting: Obstetrics and Gynecology

## 2023-02-19 ENCOUNTER — Other Ambulatory Visit: Payer: Self-pay | Admitting: Obstetrics and Gynecology

## 2023-02-19 ENCOUNTER — Other Ambulatory Visit: Payer: Self-pay | Admitting: Physician Assistant

## 2023-02-19 DIAGNOSIS — D509 Iron deficiency anemia, unspecified: Secondary | ICD-10-CM

## 2023-02-19 DIAGNOSIS — N6332 Unspecified lump in axillary tail of the left breast: Secondary | ICD-10-CM

## 2023-02-20 ENCOUNTER — Inpatient Hospital Stay: Payer: Medicare Other | Admitting: Physician Assistant

## 2023-02-20 ENCOUNTER — Inpatient Hospital Stay: Payer: Medicare Other | Attending: Physician Assistant

## 2023-02-20 ENCOUNTER — Other Ambulatory Visit: Payer: Self-pay

## 2023-02-20 VITALS — BP 136/98 | HR 80 | Temp 99.0°F | Resp 16 | Wt 151.8 lb

## 2023-02-20 DIAGNOSIS — D509 Iron deficiency anemia, unspecified: Secondary | ICD-10-CM | POA: Diagnosis not present

## 2023-02-20 DIAGNOSIS — Z90710 Acquired absence of both cervix and uterus: Secondary | ICD-10-CM | POA: Insufficient documentation

## 2023-02-20 LAB — CBC WITH DIFFERENTIAL (CANCER CENTER ONLY)
Abs Immature Granulocytes: 0.05 10*3/uL (ref 0.00–0.07)
Basophils Absolute: 0 10*3/uL (ref 0.0–0.1)
Basophils Relative: 1 %
Eosinophils Absolute: 0.2 10*3/uL (ref 0.0–0.5)
Eosinophils Relative: 4 %
HCT: 34 % — ABNORMAL LOW (ref 36.0–46.0)
Hemoglobin: 11.4 g/dL — ABNORMAL LOW (ref 12.0–15.0)
Immature Granulocytes: 1 %
Lymphocytes Relative: 40 %
Lymphs Abs: 2.5 10*3/uL (ref 0.7–4.0)
MCH: 27.9 pg (ref 26.0–34.0)
MCHC: 33.5 g/dL (ref 30.0–36.0)
MCV: 83.3 fL (ref 80.0–100.0)
Monocytes Absolute: 0.6 10*3/uL (ref 0.1–1.0)
Monocytes Relative: 9 %
Neutro Abs: 3 10*3/uL (ref 1.7–7.7)
Neutrophils Relative %: 45 %
Platelet Count: 318 10*3/uL (ref 150–400)
RBC: 4.08 MIL/uL (ref 3.87–5.11)
RDW: 16.3 % — ABNORMAL HIGH (ref 11.5–15.5)
WBC Count: 6.3 10*3/uL (ref 4.0–10.5)
nRBC: 0 % (ref 0.0–0.2)

## 2023-02-20 LAB — IRON AND IRON BINDING CAPACITY (CC-WL,HP ONLY)
Iron: 48 ug/dL (ref 28–170)
Saturation Ratios: 12 % (ref 10.4–31.8)
TIBC: 395 ug/dL (ref 250–450)
UIBC: 347 ug/dL (ref 148–442)

## 2023-02-20 LAB — FERRITIN: Ferritin: 19 ng/mL (ref 11–307)

## 2023-02-21 ENCOUNTER — Encounter: Payer: Self-pay | Admitting: Physician Assistant

## 2023-02-21 ENCOUNTER — Telehealth: Payer: Self-pay

## 2023-02-21 ENCOUNTER — Telehealth: Payer: Self-pay | Admitting: Internal Medicine

## 2023-02-21 ENCOUNTER — Other Ambulatory Visit: Payer: Self-pay

## 2023-02-21 DIAGNOSIS — D509 Iron deficiency anemia, unspecified: Secondary | ICD-10-CM

## 2023-02-21 NOTE — Progress Notes (Signed)
[place] [phone]   [phone]  PROGRESS NOTE  Patient Care Team: [name], MD as PCP - General (Internal Medicine)   CHIEF COMPLAINTS/PURPOSE OF CONSULTATION:  Iron deficiency anemia  HISTORY OF PRESENTING ILLNESS:  Kelly Garcia 57 y.o. female presents for a follow up for iron deficiency anemia. She was last seen by Dr. Alen Blew on 05/23/2022.  In the interim, she received IV feraheme 510 mg x 2 doses. She presents today to transfer care to Dr. Lorenso Courier and team. She is unaccompanied for this visit.   On exam today, Ms. Deshotels reports that her energy levels improved after receiving IV iron. She is able to complete her ADLs on her own. She has recently started to crave ice again. She denies any dietary restrictions or appetite changes. She denies nausea, vomiting or abdominal pain. Her bowel habits are unchanged without recurrent episodes of diarrhea or constipation. She denies fevers, chills, sweats, shortness of breath, chest pain or cough. She has no other complaints. Rest of the 10 point ROS is below.   MEDICAL HISTORY:  Past Medical History:  Diagnosis Date   Anxiety    Arthritis    Asthma    controlled   Carpal tunnel syndrome    Chronic pelvic pain in female    Complication of anesthesia    n/v-wants scope patch   Interstitial cystitis    Multiple allergies    PONV (postoperative nausea and vomiting)     SURGICAL HISTORY: Past Surgical History:  Procedure Laterality Date   ABDOMINAL HYSTERECTOMY     ANKLE ARTHROSCOPY     right   BREAST REDUCTION SURGERY     CARPAL TUNNEL RELEASE  04/15/2012   Procedure: CARPAL TUNNEL RELEASE;  Surgeon: Hessie Dibble, MD;  Location: [place];  Service: Orthopedics;  Laterality: Right;   CESAREAN SECTION     CYSTOSCOPY W/ DILATION OF BLADDER     multiple times for IC   CYSTOSCOPY W/ DILATION OF BLADDER     bladder instillation of medications   CYSTOSCOPY WITH  HYDRODISTENSION AND BIOPSY     DIAGNOSTIC LAPAROSCOPY     exp lap-lysis adhesions   KNEE ARTHROSCOPY     rightx6   KNEE ARTHROSCOPY     leftx2   REDUCTION MAMMAPLASTY     TRIGGER FINGER RELEASE  10/28/2012   Procedure: RELEASE TRIGGER FINGER/A-1 PULLEY;  Surgeon: Hessie Dibble, MD;  Location: [place];  Service: Orthopedics;  Laterality: Right;    SOCIAL HISTORY: Social History   Socioeconomic History   Marital status: Single    Spouse name: Not on file   Number of children: Not on file   Years of education: Not on file   Highest education level: Not on file  Occupational History   Not on file  Tobacco Use   Smoking status: Never   Smokeless tobacco: Never  Substance and Sexual Activity   Alcohol use: Yes    Comment: occ   Drug use: No   Sexual activity: Not on file  Other Topics Concern   Not on file  Social History Narrative   Not on file   Social Determinants of Health   Financial Resource Strain: Not on file  Food Insecurity: Not on file  Transportation Needs: Not on file  Physical Activity: Not on file  Stress: Not on file  Social Connections: Not on file  Intimate Partner Violence: Not on file    FAMILY HISTORY: No  family history on file.  ALLERGIES:  is allergic to buprenorphine hcl, benzoin, cephalexin, codeine, doxycycline, hydroxychloroquine, hydroxychloroquine sulfate, morphine and related, and sulfa antibiotics.  MEDICATIONS:  Current Outpatient Medications  Medication Sig Dispense Refill   aspirin EC 81 MG tablet Take by mouth.     Biotin 1 MG CAPS Take by mouth.     Cholecalciferol 50 MCG (2000 UT) CAPS Take by mouth.     dexlansoprazole (DEXILANT) 60 MG capsule Take 1 tablet by mouth daily.     Dietary Management Product (RHEUMATE) CAPS TAKE ONE CAPSULE BY MOUTH EVERY DAY for 90 days     DULoxetine (CYMBALTA) 20 MG capsule one capsule (20 mg dose).     EPINEPHrine 0.3 mg/0.3 mL IJ SOAJ injection INJECT 1 PEN IN THE MUSCLE  ONE TIME AS DIRECTED     folic acid (FOLVITE) 1 MG tablet Take by mouth.     hydrOXYzine (ATARAX) 50 MG tablet Indications: allergic reaction     lisinopril (ZESTRIL) 20 MG tablet Take 20 mg by mouth daily.     MAGNESIUM CITRATE PO Take by mouth.     Methen-Hyosc-Meth Blue-Na Phos (UROGESIC-BLUE) 81.6 MG TABS      Methotrexate Sodium (METHOTREXATE, PF,) 50 MG/2ML injection INJECT 1 ML SUBCUTANEOUS ONCE WEEKLY 30 DAYS     Omega-3 Fatty Acids (FISH OIL) 1000 MG CAPS Take by mouth.     oxyCODONE-acetaminophen (PERCOCET/ROXICET) 5-325 MG tablet Take 1-2 tablets by mouth every 4 (four) hours as needed for severe pain.     traMADol (ULTRAM) 50 MG tablet Take 1 tablet by mouth 2 (two) times daily.     FLUoxetine (PROZAC) 20 MG tablet Take 20 mg by mouth daily. 3 tabs a day (Patient not taking: Reported on 02/20/2023)     hydrOXYzine (ATARAX/VISTARIL) 25 MG tablet Take 25 mg by mouth 3 (three) times daily as needed. (Patient not taking: Reported on 02/20/2023)     lidocaine (XYLOCAINE) 2 % solution Use as directed 15 mLs in the mouth or throat as needed for mouth pain. (Patient not taking: Reported on 02/20/2023) 100 mL 0   Multiple Vitamin (MULTIVITAMIN) tablet Take 1 tablet by mouth daily. (Patient not taking: Reported on 06/08/2020)     oxyCODONE-acetaminophen (ROXICET) 5-325 MG tablet Take 1 tablet by mouth every 6 (six) hours as needed for severe pain. (Patient not taking: Reported on 02/20/2023) 20 tablet 0   phenazopyridine (PYRIDIUM) 100 MG tablet Take 100 mg by mouth 3 (three) times daily as needed. (Patient not taking: Reported on 06/08/2020)     promethazine (PHENERGAN) 25 MG tablet Take 1 mg by mouth every 8 (eight) hours as needed for nausea or vomiting. (Patient not taking: Reported on 06/08/2020)     Red Yeast Rice Extract (RED YEAST RICE PO) Take by mouth. (Patient not taking: Reported on 06/08/2020)     vitamin E 100 UNIT capsule Take by mouth daily. (Patient not taking: Reported on 02/20/2023)     No  current facility-administered medications for this visit.    REVIEW OF SYSTEMS:   Constitutional: ( - ) fevers, ( - )  chills , ( - ) night sweats Eyes: ( - ) blurriness of vision, ( - ) double vision, ( - ) watery eyes Ears, nose, mouth, throat, and face: ( - ) mucositis, ( - ) sore throat Respiratory: ( - ) cough, ( - ) dyspnea, ( - ) wheezes Cardiovascular: ( - ) palpitation, ( - ) chest discomfort, ( - ) lower  extremity swelling Gastrointestinal:  ( - ) nausea, ( - ) heartburn, ( - ) change in bowel habits Skin: ( - ) abnormal skin rashes Lymphatics: ( - ) new lymphadenopathy, ( - ) easy bruising Neurological: ( - ) numbness, ( - ) tingling, ( - ) new weaknesses Behavioral/Psych: ( - ) mood change, ( - ) new changes  All other systems were reviewed with the patient and are negative.  PHYSICAL EXAMINATION: ECOG PERFORMANCE STATUS: 0 - Asymptomatic  Vitals:   02/20/23 0934  BP: (!) 136/98  Pulse: 80  Resp: 16  Temp: 99 F (37.2 C)  SpO2: 100%   Filed Weights   02/20/23 0934  Weight: 151 lb 12.8 oz (68.9 kg)    GENERAL: well appearing female in NAD  SKIN: skin color, texture, turgor are normal, no rashes or significant lesions EYES: conjunctiva are pink and non-injected, sclera clear LUNGS: clear to auscultation and percussion with normal breathing effort HEART: regular rate & rhythm and no murmurs and no lower extremity edema Musculoskeletal: no cyanosis of digits and no clubbing  PSYCH: alert & oriented x 3, fluent speech NEURO: no focal motor/sensory deficits  LABORATORY DATA:  I have reviewed the data as listed    Latest Ref Rng & Units 02/20/2023    9:19 AM 08/04/2021    3:36 PM 12/08/2020    3:09 PM  CBC  WBC 4.0 - 10.5 K/uL 6.3  6.4  6.6   Hemoglobin 12.0 - 15.0 g/dL 11.4  12.5  10.2   Hematocrit 36.0 - 46.0 % 34.0  37.5  32.0   Platelets 150 - 400 K/uL 318  342  334        03/15/2008    6:37 PM  CMP  Glucose 94   BUN 13   Creatinine 0.71   Sodium  139   Potassium 3.7   Chloride 106   CO2 23   Calcium 9.8   Total Protein 7.4   Total Bilirubin 0.5   Alkaline Phos 54   AST 41   ALT 39     RADIOGRAPHIC STUDIES: I have personally reviewed the radiological images as listed and agreed with the findings in the report. MM DIAG BREAST TOMO BILATERAL  Result Date: 02/19/2023 CLINICAL DATA:  57 year old female presenting with a palpable area of concern in the left axilla, felt for approximately 1 month. EXAM: DIGITAL DIAGNOSTIC BILATERAL MAMMOGRAM WITH TOMOSYNTHESIS; [place] AXILLARY LEFT TECHNIQUE: Bilateral digital diagnostic mammography and breast tomosynthesis was performed.; Targeted ultrasound examination of the left axilla was performed. COMPARISON:  Previous exam(s). ACR Breast Density Category b: There are scattered areas of fibroglandular density. FINDINGS: Diagnostic mammographic images were obtained over the area of palpable concern in the left axilla. No suspicious mammographic finding is identified in this area. No suspicious mass, microcalcification, or other finding is identified in bilateral breasts. On physical exam, no palpable mass is appreciated in the left axilla. Targeted left axillary ultrasound was performed by the sonographer and the physician in the area of palpable concern. This demonstrates normal soft tissue and multiple morphologically benign lymph nodes. No suspicious solid or cystic mass or lymphadenopathy is visualized. IMPRESSION: 1. No mammographic or sonographic abnormality in the area of palpable concern in the left axilla. 2. No mammographic evidence of malignancy in bilateral breasts. RECOMMENDATION: Any further workup of the patient's symptoms should be based on the clinical assessment. Recommend routine annual screening mammogram in 1 year. I have discussed the findings and recommendations with the patient.  If applicable, a reminder letter will be sent to the patient regarding the next appointment. BI-RADS CATEGORY  1:  Negative. Electronically Signed   By: Beryle Flock M.D.   On: 02/19/2023 15:44  [place] AXILLA LEFT  Result Date: 02/19/2023 CLINICAL DATA:  57 year old female presenting with a palpable area of concern in the left axilla, felt for approximately 1 month. EXAM: DIGITAL DIAGNOSTIC BILATERAL MAMMOGRAM WITH TOMOSYNTHESIS; [place] AXILLARY LEFT TECHNIQUE: Bilateral digital diagnostic mammography and breast tomosynthesis was performed.; Targeted ultrasound examination of the left axilla was performed. COMPARISON:  Previous exam(s). ACR Breast Density Category b: There are scattered areas of fibroglandular density. FINDINGS: Diagnostic mammographic images were obtained over the area of palpable concern in the left axilla. No suspicious mammographic finding is identified in this area. No suspicious mass, microcalcification, or other finding is identified in bilateral breasts. On physical exam, no palpable mass is appreciated in the left axilla. Targeted left axillary ultrasound was performed by the sonographer and the physician in the area of palpable concern. This demonstrates normal soft tissue and multiple morphologically benign lymph nodes. No suspicious solid or cystic mass or lymphadenopathy is visualized. IMPRESSION: 1. No mammographic or sonographic abnormality in the area of palpable concern in the left axilla. 2. No mammographic evidence of malignancy in bilateral breasts. RECOMMENDATION: Any further workup of the patient's symptoms should be based on the clinical assessment. Recommend routine annual screening mammogram in 1 year. I have discussed the findings and recommendations with the patient. If applicable, a reminder letter will be sent to the patient regarding the next appointment. BI-RADS CATEGORY  1: Negative. Electronically Signed   By: Beryle Flock M.D.   On: 02/19/2023 15:44   ASSESSMENT & PLAN ANACLARA BUFKIN is a 57 y.o. female who presents to the clinic for iron deficiency anemia.   #Iron  deficiency anemia: --Etiology unknown. Possibly malabsorption of iron with medications (takes PPI).  --Patient underwent hysterectomy and does not report any signs of bleeding. --Patient is under the care of Dr. Richmond Campbell, GI from [facility]. We will request a follow up to evaluate underlying cause including GI source.  --Most recently received IV feraheme 510 mg x 2 doses from 05/24/2022-05/31/2022. --Labs today show anemia with Hgb 11.4. Iron panel shows deficiency with iron saturation 12% and ferritin 19.  --Patient is unable to tolerate PO iron. Recommend IV iron to bolster iron and hgb levels.  --Labs in 3 months (patient will get labs done at local [facility]) and RTC in 6 months with labs and follow up.    Orders Placed This Encounter  Procedures   CBC with Differential ([facility] Only)    Standing Status:   Future    Standing Expiration Date:   02/20/2024   CMP ([facility] only)    Standing Status:   Future    Standing Expiration Date:   02/20/2024   Iron and Iron Binding Capacity ([facility] only)    Standing Status:   Future    Standing Expiration Date:   02/20/2024   Ferritin    Standing Status:   Future    Standing Expiration Date:   02/20/2024    All questions were answered. The patient knows to call the clinic with any problems, questions or concerns.  I have spent a total of 30 minutes minutes of face-to-face and non-face-to-face time, preparing to see the patient, performing a medically appropriate examination, counseling and educating the patient, ordering medications, communicating with other health care professionals, documenting clinical  information in the electronic health record, and care coordination.   Dede Query, PA-C Department of Hematology/Oncology [place] at [facility] Phone: [phone]

## 2023-02-21 NOTE — Telephone Encounter (Signed)
Can you call Dr. Earlean Shawl (GI) from [facility] to request a follow up for her iron deficiency.   Pt has not seen Dr Earlean Shawl in several years so I will need to do a referral  Referral faxed and confirmation received

## 2023-02-21 NOTE — Telephone Encounter (Signed)
Per 3/6 LOS reached out to patient to schedule Feraheme x2 starting next week , per secure chat patient added to [place] , left time, date and location on patients voicemail.

## 2023-02-21 NOTE — Telephone Encounter (Signed)
let patient know that her iron levels are low so we will arrange for [facility] the [facility]  Pt advised and voiced understanding.  Pt wanted to let us know that her maternal grandmother and her great aunt suffered with IDA and they could never find the source.   Labs from 2023 and 2024 faxed to Dr Posey Rea [phone] per pt's request.  Confirmation received and pt advised.

## 2023-02-25 ENCOUNTER — Telehealth: Payer: Self-pay | Admitting: Internal Medicine

## 2023-02-25 ENCOUNTER — Encounter: Payer: Self-pay | Admitting: Physician Assistant

## 2023-02-25 NOTE — Telephone Encounter (Signed)
Per 3/6 LOS reached out to patient to schedule; left voicemail.

## 2023-02-27 ENCOUNTER — Ambulatory Visit: Payer: Medicare Other | Admitting: Hematology and Oncology

## 2023-02-27 ENCOUNTER — Other Ambulatory Visit: Payer: Medicare Other

## 2023-02-28 ENCOUNTER — Inpatient Hospital Stay: Payer: Medicare Other

## 2023-02-28 VITALS — BP 104/66 | HR 79 | Temp 98.1°F | Resp 18

## 2023-02-28 DIAGNOSIS — D509 Iron deficiency anemia, unspecified: Secondary | ICD-10-CM

## 2023-02-28 MED ORDER — SODIUM CHLORIDE 0.9 % IV SOLN
510.0000 mg | Freq: Once | INTRAVENOUS | Status: AC
  Administered 2023-02-28: 510 mg via INTRAVENOUS
  Filled 2023-02-28: qty 17

## 2023-02-28 MED ORDER — SODIUM CHLORIDE 0.9 % IV SOLN: Freq: Once | INTRAVENOUS | Status: AC

## 2023-02-28 NOTE — Patient Instructions (Signed)

## 2023-03-07 ENCOUNTER — Inpatient Hospital Stay: Payer: Medicare Other

## 2023-03-07 VITALS — BP 120/80 | HR 75 | Temp 98.2°F | Resp 18 | Wt 149.4 lb

## 2023-03-07 DIAGNOSIS — D509 Iron deficiency anemia, unspecified: Secondary | ICD-10-CM | POA: Diagnosis not present

## 2023-03-07 MED ORDER — SODIUM CHLORIDE 0.9 % IV SOLN: Freq: Once | INTRAVENOUS | Status: AC

## 2023-03-07 MED ORDER — SODIUM CHLORIDE 0.9 % IV SOLN
510.0000 mg | Freq: Once | INTRAVENOUS | Status: AC
  Administered 2023-03-07: 510 mg via INTRAVENOUS
  Filled 2023-03-07: qty 510

## 2023-03-07 NOTE — Patient Instructions (Signed)

## 2023-06-06 ENCOUNTER — Other Ambulatory Visit: Payer: Self-pay | Admitting: Physician Assistant

## 2023-06-07 LAB — CBC/DIFF AMBIGUOUS DEFAULT
Basophils Absolute: 0 10*3/uL (ref 0.0–0.2)
Basos: 0 %
EOS (ABSOLUTE): 0.2 10*3/uL (ref 0.0–0.4)
Eos: 2 %
Hematocrit: 40.3 % (ref 34.0–46.6)
Hemoglobin: 13.7 g/dL (ref 11.1–15.9)
Immature Grans (Abs): 0 10*3/uL (ref 0.0–0.1)
Immature Granulocytes: 1 %
Lymphocytes Absolute: 2.6 10*3/uL (ref 0.7–3.1)
Lymphs: 36 %
MCH: 29.1 pg (ref 26.6–33.0)
MCHC: 34 g/dL (ref 31.5–35.7)
MCV: 86 fL (ref 79–97)
Monocytes Absolute: 0.6 10*3/uL (ref 0.1–0.9)
Monocytes: 9 %
Neutrophils Absolute: 3.8 10*3/uL (ref 1.4–7.0)
Neutrophils: 52 %
Platelets: 312 10*3/uL (ref 150–450)
RBC: 4.7 x10E6/uL (ref 3.77–5.28)
RDW: 14.3 % (ref 11.7–15.4)
WBC: 7.2 10*3/uL (ref 3.4–10.8)

## 2023-06-07 LAB — COMPREHENSIVE METABOLIC PANEL
ALT: 31 IU/L (ref 0–32)
AST: 23 IU/L (ref 0–40)
Albumin: 4.6 g/dL (ref 3.8–4.9)
Alkaline Phosphatase: 81 IU/L (ref 44–121)
BUN/Creatinine Ratio: 18 (ref 9–23)
BUN: 15 mg/dL (ref 6–24)
Bilirubin Total: 0.2 mg/dL (ref 0.0–1.2)
CO2: 23 mmol/L (ref 20–29)
Calcium: 10.3 mg/dL — ABNORMAL HIGH (ref 8.7–10.2)
Chloride: 102 mmol/L (ref 96–106)
Creatinine, Ser: 0.83 mg/dL (ref 0.57–1.00)
Globulin, Total: 2.3 g/dL (ref 1.5–4.5)
Glucose: 98 mg/dL (ref 70–99)
Potassium: 4.4 mmol/L (ref 3.5–5.2)
Sodium: 141 mmol/L (ref 134–144)
Total Protein: 6.9 g/dL (ref 6.0–8.5)
eGFR: 83 mL/min/{1.73_m2} (ref >59)

## 2023-06-07 LAB — IRON AND TIBC
Iron Saturation: 40 % (ref 15–55)
Iron: 121 ug/dL (ref 27–159)
Total Iron Binding Capacity: 302 ug/dL (ref 250–450)
UIBC: 181 ug/dL (ref 131–425)

## 2023-06-07 LAB — FERRITIN: Ferritin: 367 ng/mL — ABNORMAL HIGH (ref 15–150)

## 2023-06-07 LAB — SPECIMEN STATUS REPORT

## 2023-06-17 ENCOUNTER — Telehealth: Payer: Self-pay

## 2023-06-17 NOTE — Telephone Encounter (Signed)
LM for pt with lab results.   ?

## 2023-06-17 NOTE — Telephone Encounter (Signed)
-----   Message from Briant Cedar, PA-C sent at 06/17/2023 12:25 PM EDT ----- Please notify patient that her iron deficiency anemia has resolved. No need for additional IV iron at this time   ----- Message ----- From: Interface, [facility] Lab Results In Sent: 06/10/2023   6:09 AM EDT To: Briant Cedar, PA-C

## 2023-07-22 ENCOUNTER — Other Ambulatory Visit: Payer: Self-pay | Admitting: Internal Medicine

## 2023-07-22 DIAGNOSIS — R109 Unspecified abdominal pain: Secondary | ICD-10-CM

## 2023-07-23 ENCOUNTER — Ambulatory Visit: Admission: RE | Admit: 2023-07-23 | Payer: Medicare Other | Source: Ambulatory Visit

## 2023-07-23 DIAGNOSIS — R109 Unspecified abdominal pain: Secondary | ICD-10-CM

## 2023-07-29 ENCOUNTER — Other Ambulatory Visit: Payer: Self-pay | Admitting: Internal Medicine

## 2023-07-29 DIAGNOSIS — R110 Nausea: Secondary | ICD-10-CM

## 2023-07-29 DIAGNOSIS — R1013 Epigastric pain: Secondary | ICD-10-CM

## 2023-08-06 ENCOUNTER — Telehealth: Payer: Self-pay

## 2023-08-06 NOTE — Telephone Encounter (Signed)
Phone call to patient to review instructions for 13 hr prep for CT w/ contrast on 9/3/2  at 1:40PM. Prescription called into [facility] in [place], [place]. Pt aware and verbalized understanding of instructions. Prescription: 12:40AM- 50mg  Prednisone  6:40AM- 50mg  Prednisone 12:40PM - 50mg  Prednisone and 50mg  Benadryl

## 2023-08-20 ENCOUNTER — Inpatient Hospital Stay: Admission: RE | Admit: 2023-08-20 | Payer: Medicare Other | Source: Ambulatory Visit

## 2023-08-20 DIAGNOSIS — R110 Nausea: Secondary | ICD-10-CM

## 2023-08-20 DIAGNOSIS — R1013 Epigastric pain: Secondary | ICD-10-CM

## 2023-08-20 MED ORDER — IOPAMIDOL (ISOVUE-370) INJECTION 76%
500.0000 mL | Freq: Once | INTRAVENOUS | Status: AC | PRN
  Administered 2023-08-20: 80 mL via INTRAVENOUS

## 2023-08-28 ENCOUNTER — Telehealth: Payer: Self-pay | Admitting: *Deleted

## 2023-08-28 ENCOUNTER — Inpatient Hospital Stay: Payer: Medicare Other | Admitting: Hematology and Oncology

## 2023-08-28 ENCOUNTER — Telehealth: Payer: Self-pay | Admitting: Physician Assistant

## 2023-08-28 ENCOUNTER — Inpatient Hospital Stay: Payer: Medicare Other

## 2023-08-28 NOTE — Telephone Encounter (Signed)
PC to patient regarding missed appointments today, she thought they were tomorrow & wants to be seen as soon as possible.  Informed patient our scheduling department will contact her to reschedule.  She verbalizes understanding, scheduling message sent.

## 2023-08-28 NOTE — Progress Notes (Signed)
Cancelled per patient.

## 2023-08-29 ENCOUNTER — Ambulatory Visit: Payer: Medicare Other | Admitting: Hematology and Oncology

## 2023-08-29 ENCOUNTER — Inpatient Hospital Stay: Payer: Medicare Other | Attending: Physician Assistant

## 2023-08-29 ENCOUNTER — Inpatient Hospital Stay: Payer: Medicare Other | Admitting: Physician Assistant

## 2023-08-29 ENCOUNTER — Other Ambulatory Visit: Payer: Medicare Other

## 2023-08-29 DIAGNOSIS — D509 Iron deficiency anemia, unspecified: Secondary | ICD-10-CM | POA: Insufficient documentation

## 2023-08-29 LAB — CMP (CANCER CENTER ONLY)
ALT: 27 U/L (ref 0–44)
AST: 24 U/L (ref 15–41)
Albumin: 4.6 g/dL (ref 3.5–5.0)
Alkaline Phosphatase: 58 U/L (ref 38–126)
Anion gap: 9 (ref 5–15)
BUN: 17 mg/dL (ref 6–20)
CO2: 29 mmol/L (ref 22–32)
Calcium: 9.9 mg/dL (ref 8.9–10.3)
Chloride: 101 mmol/L (ref 98–111)
Creatinine: 0.99 mg/dL (ref 0.44–1.00)
GFR, Estimated: >60 mL/min (ref >60)
Glucose, Bld: 99 mg/dL (ref 70–99)
Potassium: 3.8 mmol/L (ref 3.5–5.1)
Sodium: 139 mmol/L (ref 135–145)
Total Bilirubin: 0.5 mg/dL (ref 0.3–1.2)
Total Protein: 7.2 g/dL (ref 6.5–8.1)

## 2023-08-29 LAB — FERRITIN: Ferritin: 183 ng/mL (ref 11–307)

## 2023-08-29 LAB — CBC WITH DIFFERENTIAL (CANCER CENTER ONLY)
Abs Immature Granulocytes: 0.02 10*3/uL (ref 0.00–0.07)
Basophils Absolute: 0 10*3/uL (ref 0.0–0.1)
Basophils Relative: 1 %
Eosinophils Absolute: 0.1 10*3/uL (ref 0.0–0.5)
Eosinophils Relative: 3 %
HCT: 40.2 % (ref 36.0–46.0)
Hemoglobin: 13.2 g/dL (ref 12.0–15.0)
Immature Granulocytes: 0 %
Lymphocytes Relative: 44 %
Lymphs Abs: 2.4 10*3/uL (ref 0.7–4.0)
MCH: 29.5 pg (ref 26.0–34.0)
MCHC: 32.8 g/dL (ref 30.0–36.0)
MCV: 89.9 fL (ref 80.0–100.0)
Monocytes Absolute: 0.5 10*3/uL (ref 0.1–1.0)
Monocytes Relative: 8 %
Neutro Abs: 2.4 10*3/uL (ref 1.7–7.7)
Neutrophils Relative %: 44 %
Platelet Count: 303 10*3/uL (ref 150–400)
RBC: 4.47 MIL/uL (ref 3.87–5.11)
RDW: 12.5 % (ref 11.5–15.5)
WBC Count: 5.5 10*3/uL (ref 4.0–10.5)
nRBC: 0 % (ref 0.0–0.2)

## 2023-08-29 LAB — IRON AND IRON BINDING CAPACITY (CC-WL,HP ONLY)
Iron: 74 ug/dL (ref 28–170)
Saturation Ratios: 21 % (ref 10.4–31.8)
TIBC: 354 ug/dL (ref 250–450)
UIBC: 280 ug/dL (ref 148–442)

## 2023-08-29 NOTE — Progress Notes (Signed)
[facility] [phone]   [phone]  PROGRESS NOTE  Patient Care Team: Creola Corn, MD as PCP - General (Internal Medicine)   CHIEF COMPLAINTS/PURPOSE OF CONSULTATION:  Iron deficiency anemia  HISTORY OF PRESENTING ILLNESS:  Kelly Garcia 57 y.o. female presents for a follow up for iron deficiency anemia. She was last seen on 02/20/2023.  In the interim, she received IV feraheme 510 mg x 2 doses. She presents today for follow up. She is unaccompanied for this visit.   On exam today, Kelly Garcia reports that her energy levels is starting to decline but she can complete her ADLs on her own. She reports having ongoing GI symptoms including epigastric/central chest pain and acid reflux. She adds that she is undergoing stress test tomorrow to rule out cardiac etiology. She reports her bowel habits are unchanged without recurrent episodes of diarrhea or constipation. She denies fevers, chills, sweats, shortness of breath, chest pain or cough. She has no other complaints. Rest of the 10 point ROS is below.   MEDICAL HISTORY:  Past Medical History:  Diagnosis Date   Anxiety    Arthritis    Asthma    controlled   Carpal tunnel syndrome    Chronic pelvic pain in female    Complication of anesthesia    n/v-wants scope patch   Interstitial cystitis    Multiple allergies    PONV (postoperative nausea and vomiting)     SURGICAL HISTORY: Past Surgical History:  Procedure Laterality Date   ABDOMINAL HYSTERECTOMY     ANKLE ARTHROSCOPY     right   BREAST REDUCTION SURGERY     CARPAL TUNNEL RELEASE  04/15/2012   Procedure: CARPAL TUNNEL RELEASE;  Surgeon: Velna Ochs, MD;  Location: [facility];  Service: Orthopedics;  Laterality: Right;   CESAREAN SECTION     CYSTOSCOPY W/ DILATION OF BLADDER     multiple times for IC   CYSTOSCOPY W/ DILATION OF BLADDER     bladder instillation of medications   CYSTOSCOPY WITH HYDRODISTENSION AND  BIOPSY     DIAGNOSTIC LAPAROSCOPY     exp lap-lysis adhesions   KNEE ARTHROSCOPY     rightx6   KNEE ARTHROSCOPY     leftx2   REDUCTION MAMMAPLASTY     TRIGGER FINGER RELEASE  10/28/2012   Procedure: RELEASE TRIGGER FINGER/A-1 PULLEY;  Surgeon: Velna Ochs, MD;  Location: [facility];  Service: Orthopedics;  Laterality: Right;    SOCIAL HISTORY: Social History   Socioeconomic History   Marital status: Single    Spouse name: Not on file   Number of children: Not on file   Years of education: Not on file   Highest education level: Not on file  Occupational History   Not on file  Tobacco Use   Smoking status: Never   Smokeless tobacco: Never  Substance and Sexual Activity   Alcohol use: Yes    Comment: occ   Drug use: No   Sexual activity: Not on file  Other Topics Concern   Not on file  Social History Narrative   Not on file   Social Determinants of Health   Financial Resource Strain: Not on file  Food Insecurity: No Food Insecurity (08/22/2023)   Received from [facility]   Hunger Vital Sign    Worried About Running Out of Food in the Last Year: Never true    Ran Out of Food in the Last Year: Never  true  Transportation Needs: No Transportation Needs (08/22/2023)   Received from [facility]   [facility] - Transportation    Lack of Transportation (Medical): No    Lack of Transportation (Non-Medical): No  Physical Activity: Not on file  Stress: Not on file  Social Connections: Unknown (02/12/2023)   Received from [facility]   Social Network    Social Network: Not on file  Intimate Partner Violence: Unknown (02/12/2023)   Received from [facility], [facility]   HITS    Physically Hurt: Not on file    Insult or Talk Down To: Not on file    Threaten Physical Harm: Not on file    Scream or Curse: Not on file    FAMILY HISTORY: No family history on file.  ALLERGIES:  is allergic to buprenorphine hcl, benzoin,  cephalexin, codeine, doxycycline, hydroxychloroquine, hydroxychloroquine sulfate, iodinated contrast media, morphine and codeine, and sulfa antibiotics.  MEDICATIONS:  Current Outpatient Medications  Medication Sig Dispense Refill   aspirin EC 81 MG tablet Take by mouth.     DULoxetine (CYMBALTA) 20 MG capsule one capsule (20 mg dose).     EPINEPHrine 0.3 mg/0.3 mL IJ SOAJ injection INJECT 1 PEN IN THE MUSCLE ONE TIME AS DIRECTED     FOLIC ACID PO Take by mouth.     hydrOXYzine (ATARAX) 50 MG tablet Indications: allergic reaction     lisinopril (ZESTRIL) 20 MG tablet Take 20 mg by mouth daily.     Methen-Hyosc-Meth Blue-Na Phos (UROGESIC-BLUE) 81.6 MG TABS      oxyCODONE-acetaminophen (PERCOCET/ROXICET) 5-325 MG tablet Take 1-2 tablets by mouth every 4 (four) hours as needed for severe pain.     pantoprazole (PROTONIX) 40 MG tablet Take 1 tablet by mouth daily.     raNITIdine HCl (ZANTAC PO) Take by mouth.     Sucralfate (CARAFATE PO) Take by mouth.     traMADol (ULTRAM) 50 MG tablet Take 1 tablet by mouth 2 (two) times daily.     Biotin 1 MG CAPS Take by mouth. (Patient not taking: Reported on 08/29/2023)     Cholecalciferol 50 MCG (2000 UT) CAPS Take by mouth. (Patient not taking: Reported on 08/29/2023)     dexlansoprazole (DEXILANT) 60 MG capsule Take 1 tablet by mouth daily. (Patient not taking: Reported on 08/29/2023)     Dietary Management Product (RHEUMATE) CAPS TAKE ONE CAPSULE BY MOUTH EVERY DAY for 90 days (Patient not taking: Reported on 08/29/2023)     folic acid (FOLVITE) 1 MG tablet Take by mouth. (Patient not taking: Reported on 08/29/2023)     MAGNESIUM CITRATE PO Take by mouth. (Patient not taking: Reported on 08/29/2023)     Methotrexate Sodium (METHOTREXATE, PF,) 50 MG/2ML injection INJECT 1 ML SUBCUTANEOUS ONCE WEEKLY 30 DAYS (Patient not taking: Reported on 08/29/2023)     Omega-3 Fatty Acids (FISH OIL) 1000 MG CAPS Take by mouth. (Patient not taking: Reported on 08/29/2023)      No current facility-administered medications for this visit.    REVIEW OF SYSTEMS:   Constitutional: ( - ) fevers, ( - )  chills , ( - ) night sweats Eyes: ( - ) blurriness of vision, ( - ) double vision, ( - ) watery eyes Ears, nose, mouth, throat, and face: ( - ) mucositis, ( - ) sore throat Respiratory: ( - ) cough, ( - ) dyspnea, ( - ) wheezes Cardiovascular: ( - ) palpitation, ( - ) chest discomfort, ( - )  lower extremity swelling Gastrointestinal:  ( - ) nausea, ( - ) heartburn, ( - ) change in bowel habits Skin: ( - ) abnormal skin rashes Lymphatics: ( - ) new lymphadenopathy, ( - ) easy bruising Neurological: ( - ) numbness, ( - ) tingling, ( - ) new weaknesses Behavioral/Psych: ( - ) mood change, ( - ) new changes  All other systems were reviewed with the patient and are negative.  PHYSICAL EXAMINATION: ECOG PERFORMANCE STATUS: 0 - Asymptomatic  There were no vitals filed for this visit.  There were no vitals filed for this visit.   GENERAL: well appearing female in NAD  SKIN: skin color, texture, turgor are normal, no rashes or significant lesions EYES: conjunctiva are pink and non-injected, sclera clear LUNGS: clear to auscultation and percussion with normal breathing effort HEART: regular rate & rhythm and no murmurs and no lower extremity edema Musculoskeletal: no cyanosis of digits and no clubbing  PSYCH: alert & oriented x 3, fluent speech NEURO: no focal motor/sensory deficits  LABORATORY DATA:  I have reviewed the data as listed    Latest Ref Rng & Units 08/29/2023    9:42 AM 06/06/2023    1:50 PM 02/20/2023    9:19 AM  CBC  WBC 4.0 - 10.5 K/uL 5.5  7.2  6.3   Hemoglobin 12.0 - 15.0 g/dL 19.1  47.8  29.5   Hematocrit 36.0 - 46.0 % 40.2  40.3  34.0   Platelets 150 - 400 K/uL 303  312  318        Latest Ref Rng & Units 08/29/2023    9:42 AM 06/06/2023    1:50 PM 03/15/2008    6:37 PM  CMP  Glucose 70 - 99 mg/dL 99  98  94   BUN 6 - 20 mg/dL 17  15  13     Creatinine 0.44 - 1.00 mg/dL 6.21  3.08  6.57   Sodium 135 - 145 mmol/L 139  141  139   Potassium 3.5 - 5.1 mmol/L 3.8  4.4  3.7   Chloride 98 - 111 mmol/L 101  102  106   CO2 22 - 32 mmol/L 29  23  23    Calcium 8.9 - 10.3 mg/dL 9.9  84.6  9.8   Total Protein 6.5 - 8.1 g/dL 7.2  6.9  7.4   Total Bilirubin 0.3 - 1.2 mg/dL 0.5  0.2  0.5   Alkaline Phos 38 - 126 U/L 58  81  54   AST 15 - 41 U/L 24  23  41   ALT 0 - 44 U/L 27  31  39     RADIOGRAPHIC STUDIES: I have personally reviewed the radiological images as listed and agreed with the findings in the report. CT ABDOMEN PELVIS W CONTRAST  Result Date: 08/24/2023 CLINICAL DATA:  Chronic generalized abdominal pain common nausea, epigastric pain. EXAM: CT ABDOMEN AND PELVIS WITH CONTRAST TECHNIQUE: Multidetector CT imaging of the abdomen and pelvis was performed using the standard protocol following bolus administration of intravenous contrast. RADIATION DOSE REDUCTION: This exam was performed according to the departmental dose-optimization program which includes automated exposure control, adjustment of the mA and/or kV according to patient size and/or use of iterative reconstruction technique. CONTRAST:  80mL ISOVUE-370 IOPAMIDOL (ISOVUE-370) INJECTION 76% COMPARISON:  Ultrasound September 22, 2023. FINDINGS: Lower chest: No acute abnormality. Hepatobiliary: Arterially hyperenhancing 10 mm focus in the posterior right lobe of the liver on image 18/2. Gallbladder is unremarkable. No biliary  ductal dilation. Pancreas: Pancreatic ductal dilation or evidence of acute inflammation. Spleen: No splenomegaly. Adrenals/Urinary Tract: Bilateral adrenal glands appear normal. No hydronephrosis. Kidneys demonstrate symmetric enhancement. Mild wall thickening of a nondistended urinary bladder. Stomach/Bowel: Radiopaque enteric contrast material traverses distal loops of small bowel. Stomach is distended with ingested material without focal wall thickening. No  pathologic dilation of small or large bowel. No evidence of acute bowel inflammation. Hyperdense metallic object in or adjacent to the transverse colon on image 30/2. Vascular/Lymphatic: Normal caliber abdominal aorta. Smooth IVC contours. The portal, splenic and superior mesenteric veins are patent. Reproductive: Status post hysterectomy. No adnexal masses. Other: No significant abdominopelvic free fluid. Left subcutaneous posterior gluteal presacral stimulating generator. Musculoskeletal: Multilevel degenerative changes spine. IMPRESSION: 1. Mild wall thickening of a nondistended urinary bladder, correlate with urinalysis to exclude cystitis. 2. Arterially hyperenhancing 10 mm focus in the posterior right lobe of the liver, nonspecific but favored to reflect a flash filling hemangioma. Consider more definitive assessment by nonemergent liver protocol MRI with and without contrast. 3. Hyperdense metallic object in or adjacent to the transverse colon, correlate with any history of ingested foreign body and surgical history. Electronically Signed   By: Maudry Mayhew M.D.   On: 08/24/2023 12:53    ASSESSMENT & PLAN Kelly Garcia is a 57 y.o. female who presents to the clinic for iron deficiency anemia.   #Iron deficiency anemia: --Etiology unknown. Possibly malabsorption of iron with medications (takes PPI).  --She is unable to tolerate PO iron therapy. --Patient underwent hysterectomy and does not report any signs of bleeding. --Patient is under the care of GI from [facility]. Underwent EGD/colonoscopy on 06/18/2023. EGD showed mild, localized abnormal mucosa in the stomach, consistent with gastritis;  no bleeding was identified. Colonoscopy was normal but stool was in the cecum and right colon. Patient is planning to undergo repeat colonoscopy due to incomplete evaluation. --Most recently received IV feraheme 510 mg x 2 doses from 02/28/2023-03/07/2023.  --Labs today show no anemia with Hgb 13.2. Iron  panel shows ferritin 183, serum iron 74, saturation 21%.  --No need for additional IV iron at this time. --Labs in 3 months (patient will get labs done at local [facility]) and RTC in 6 months with labs and follow up.    Orders Placed This Encounter  Procedures   Ferritin    Standing Status:   Future    Standing Expiration Date:   08/28/2024   CBC With Differential    Standing Status:   Future    Standing Expiration Date:   08/28/2024   Iron and TIBC    Standing Status:   Future    Standing Expiration Date:   08/28/2024    All questions were answered. The patient knows to call the clinic with any problems, questions or concerns.  I have spent a total of 30 minutes minutes of face-to-face and non-face-to-face time, preparing to see the patient, performing a medically appropriate examination, counseling and educating the patient, ordering medications, communicating with other health care professionals, documenting clinical information in the electronic health record, and care coordination.   Georga Kaufmann, PA-C Department of Hematology/Oncology [facility] Phone: [phone]

## 2023-08-30 ENCOUNTER — Telehealth: Payer: Self-pay

## 2023-08-30 NOTE — Telephone Encounter (Signed)
LM for pt with lab results.   ?

## 2023-08-30 NOTE — Telephone Encounter (Signed)
-----   Message from Briant Cedar sent at 08/29/2023  5:09 PM EDT ----- Please notify patient that iron levels are normal. No need for additional IV iron at this time

## 2023-08-31 ENCOUNTER — Encounter: Payer: Self-pay | Admitting: Physician Assistant

## 2023-09-03 ENCOUNTER — Telehealth: Payer: Self-pay | Admitting: Physician Assistant

## 2023-09-03 NOTE — Telephone Encounter (Signed)
Left patient a message regarding scheduled appointment times/dates

## 2023-09-06 ENCOUNTER — Encounter: Payer: Self-pay | Admitting: Physician Assistant

## 2023-11-05 ENCOUNTER — Ambulatory Visit: Payer: Medicare Other | Admitting: Podiatry

## 2024-02-03 ENCOUNTER — Ambulatory Visit (INDEPENDENT_AMBULATORY_CARE_PROVIDER_SITE_OTHER): Payer: Medicare Other

## 2024-02-03 ENCOUNTER — Encounter: Payer: Self-pay | Admitting: Podiatry

## 2024-02-03 ENCOUNTER — Ambulatory Visit: Payer: Medicare Other | Admitting: Podiatry

## 2024-02-03 DIAGNOSIS — M2041 Other hammer toe(s) (acquired), right foot: Secondary | ICD-10-CM | POA: Diagnosis not present

## 2024-02-03 DIAGNOSIS — D2372 Other benign neoplasm of skin of left lower limb, including hip: Secondary | ICD-10-CM

## 2024-02-03 DIAGNOSIS — M79671 Pain in right foot: Secondary | ICD-10-CM

## 2024-02-04 ENCOUNTER — Encounter: Payer: Self-pay | Admitting: Podiatry

## 2024-02-05 ENCOUNTER — Other Ambulatory Visit: Payer: Self-pay | Admitting: Obstetrics and Gynecology

## 2024-02-05 DIAGNOSIS — Z1231 Encounter for screening mammogram for malignant neoplasm of breast: Secondary | ICD-10-CM

## 2024-02-05 NOTE — Progress Notes (Signed)
Subjective:   Patient ID: Kelly Garcia, female   DOB: 58 y.o.   MRN: [id number]   HPI Chief Complaint  Patient presents with   Foot Pain    RM#12 Right foot fourth toe pain when wearing toe pad makes it worse feels like more pressure. Has been hurting for several months pain scale at a 10 on that toe.   58 year old female presents the office today with concerns of a painful corn on the right fourth toe and the fourth and fifth toes rubbing.  She tried to trim it herself but is causing discomfort.  She has a history of arthroplasty of the fourth toe in 2018.  She said that over time the symptoms have worsened and are more consistent.  No open lesions.   Review of Systems  All other systems reviewed and are negative.  Past Medical History:  Diagnosis Date   Anxiety    Arthritis    Asthma    controlled   Carpal tunnel syndrome    Chronic pelvic pain in female    Complication of anesthesia    n/v-wants scope patch   Interstitial cystitis    Multiple allergies    PONV (postoperative nausea and vomiting)     Past Surgical History:  Procedure Laterality Date   ABDOMINAL HYSTERECTOMY     ANKLE ARTHROSCOPY     right   BREAST REDUCTION SURGERY     CARPAL TUNNEL RELEASE  04/15/2012   Procedure: CARPAL TUNNEL RELEASE;  Surgeon: Velna Ochs, MD;  Location: [facility];  Service: Orthopedics;  Laterality: Right;   CESAREAN SECTION     CYSTOSCOPY W/ DILATION OF BLADDER     multiple times for IC   CYSTOSCOPY W/ DILATION OF BLADDER     bladder instillation of medications   CYSTOSCOPY WITH HYDRODISTENSION AND BIOPSY     DIAGNOSTIC LAPAROSCOPY     exp lap-lysis adhesions   KNEE ARTHROSCOPY     rightx6   KNEE ARTHROSCOPY     leftx2   REDUCTION MAMMAPLASTY     TRIGGER FINGER RELEASE  10/28/2012   Procedure: RELEASE TRIGGER FINGER/A-1 PULLEY;  Surgeon: Velna Ochs, MD;  Location: [facility];  Service: Orthopedics;  Laterality: Right;      Current Outpatient Medications:    aspirin EC 81 MG tablet, Take by mouth., Disp: , Rfl:    EPINEPHrine 0.3 mg/0.3 mL IJ SOAJ injection, INJECT 1 PEN IN THE MUSCLE ONE TIME AS DIRECTED, Disp: , Rfl:    FOLIC ACID PO, Take by mouth., Disp: , Rfl:    hydrOXYzine (ATARAX) 50 MG tablet, Indications: allergic reaction, Disp: , Rfl:    lisinopril (ZESTRIL) 20 MG tablet, Take 20 mg by mouth daily., Disp: , Rfl:    Methen-Hyosc-Meth Blue-Na Phos (UROGESIC-BLUE) 81.6 MG TABS, , Disp: , Rfl:    Omega-3 Fatty Acids (FISH OIL) 1000 MG CAPS, Take by mouth., Disp: , Rfl:    oxyCODONE-acetaminophen (PERCOCET/ROXICET) 5-325 MG tablet, Take 1-2 tablets by mouth every 4 (four) hours as needed for severe pain., Disp: , Rfl:    pantoprazole (PROTONIX) 40 MG tablet, Take 1 tablet by mouth daily., Disp: , Rfl:    raNITIdine HCl (ZANTAC PO), Take by mouth., Disp: , Rfl:    Sucralfate (CARAFATE PO), Take by mouth., Disp: , Rfl:    traMADol (ULTRAM) 50 MG tablet, Take 1 tablet by mouth 2 (two) times daily., Disp: , Rfl:    DULoxetine (CYMBALTA) 20 MG capsule,  one capsule (20 mg dose). (Patient not taking: Reported on 02/03/2024), Disp: , Rfl:   Allergies  Allergen Reactions   Buprenorphine Hcl Itching   Benzoin     blister   Cephalexin    Codeine Nausea And Vomiting   Doxycycline Nausea And Vomiting   Hydroxychloroquine Nausea And Vomiting   Hydroxychloroquine Sulfate Other (See Comments)   Iodinated Contrast Media Hives   Morphine And Codeine Itching   Sulfa Antibiotics Hives and Other (See Comments)           Objective:  Physical Exam  General: AAO x3, NAD  Dermatological: Hyperkeratotic lesion noted along the IPJ of the right fourth toe.  Upon debridement there is no underlying ulceration, drainage or signs of infection.  Vascular: Dorsalis Pedis artery and Posterior Tibial artery pedal pulses are 2/4 bilateral with immedate capillary fill time. There is no pain with calf compression,  swelling, warmth, erythema.   Neruologic: Grossly intact via light touch bilateral.  Musculoskeletal: Bony prominence noted on the lateral aspect fourth toe.  Upon weightbearing the fourth and fifth toes do seem to rub in this area as well.  Tenderness to the skin lesion.     Assessment:   58 year old female skin lesion, bony prominence right fourth toe     Plan:  -Treatment options discussed including all alternatives, risks, and complications -Etiology of symptoms were discussed -X-rays were obtained and reviewed with the patient.  Previous arthroplasty in the right lower toe.  No evidence of acute fracture. -We discussed the conservative as well as surgical treatment options.  Today we will start conservative treatment.  Discussed surgical options including exostectomy fourth toe, arthroplasty of the fifth toe patient to proceed with shaving of the fourth and fifth toes if her symptoms persist. -I have debrided lesion with any complications or bleeding.  I applied a small amount of salicylic acid and a bandage to the area.  Postprocedure infection discussed.  Monitor for any signs or symptoms of infection.    Vivi Barrack DPM

## 2024-02-20 ENCOUNTER — Telehealth: Payer: Self-pay | Admitting: Podiatry

## 2024-02-20 NOTE — Telephone Encounter (Signed)
 DOS: 03/18/24  RIGHT EXOSTECTOMY X2 [id number]    [employer] 01/18/2024 - 12/16/2024   OOP: $4,900.00 REMAINING: $4,270.00 CO: 0%  PER THE [employer] WEBSITE PRIOR AUTH IS NOT REQ FOR CPT COED 91478   DEC ID: [id number]

## 2024-02-21 ENCOUNTER — Ambulatory Visit: Payer: Medicare Other

## 2024-02-26 ENCOUNTER — Telehealth: Payer: Self-pay | Admitting: Physician Assistant

## 2024-02-26 NOTE — Telephone Encounter (Signed)
 Patient called to cancel appointments. Patient said she will call back to reschedule at a later time.

## 2024-03-02 ENCOUNTER — Ambulatory Visit: Payer: Medicare Other | Admitting: Podiatry

## 2024-03-03 ENCOUNTER — Other Ambulatory Visit: Payer: Medicare Other

## 2024-03-03 ENCOUNTER — Ambulatory Visit: Payer: Medicare Other | Admitting: Physician Assistant

## 2024-03-06 ENCOUNTER — Ambulatory Visit: Payer: Medicare Other | Admitting: Podiatry

## 2024-03-18 ENCOUNTER — Other Ambulatory Visit: Payer: Self-pay | Admitting: Podiatry

## 2024-03-18 DIAGNOSIS — M25774 Osteophyte, right foot: Secondary | ICD-10-CM | POA: Diagnosis not present

## 2024-03-18 MED ORDER — HYDROCODONE-ACETAMINOPHEN 5-325 MG PO TABS
1.0000 | ORAL_TABLET | Freq: Four times a day (QID) | ORAL | 0 refills | Status: AC | PRN
Start: 2024-03-18 — End: ?

## 2024-03-18 MED ORDER — FLUCONAZOLE 150 MG PO TABS: 150.0000 mg | ORAL_TABLET | Freq: Once | ORAL | 0 refills | Status: AC

## 2024-03-18 MED ORDER — PROMETHAZINE HCL 25 MG RE SUPP: 25.0000 mg | Freq: Four times a day (QID) | RECTAL | 0 refills | Status: AC | PRN

## 2024-03-18 NOTE — Progress Notes (Signed)
 Post-op medications sent  She has done well with vicodin in the past she reports

## 2024-03-23 ENCOUNTER — Encounter: Payer: Self-pay | Admitting: Podiatry

## 2024-03-23 ENCOUNTER — Ambulatory Visit (INDEPENDENT_AMBULATORY_CARE_PROVIDER_SITE_OTHER)

## 2024-03-23 ENCOUNTER — Ambulatory Visit

## 2024-03-23 ENCOUNTER — Ambulatory Visit (INDEPENDENT_AMBULATORY_CARE_PROVIDER_SITE_OTHER): Admitting: Podiatry

## 2024-03-23 DIAGNOSIS — M79671 Pain in right foot: Secondary | ICD-10-CM | POA: Diagnosis not present

## 2024-03-23 DIAGNOSIS — M2061 Acquired deformities of toe(s), unspecified, right foot: Secondary | ICD-10-CM

## 2024-03-23 NOTE — Patient Instructions (Signed)
 You can use UREA 40% CREAM on the heels

## 2024-03-23 NOTE — Progress Notes (Unsigned)
 Subjective: Chief Complaint  Patient presents with   Routine Post Op    RM#13 POV#1 patient states doing well but surgical shoe is to hard for her.No signs of swelling or infection present.   58 year old female presents the office above concerns.  She states that she has been doing well not in any pain.  Does not report any fevers or chills.  Also concern for nail fungus.  She has no concerns today.  Objective: AAO x3, NAD DP/PT pulses palpable bilaterally, CRT less than 3 seconds Nails mildly discolored with yellow, brown discoloration.  No edema, erythema or signs of infection. To the right 4th and 5th toes sutures are intact without any evidence of dehiscence.  There is no surrounding erythema, ascending cellulitis.  No drainage or pus.  No fluctuation or crepitation.  There is no malodor. No pain with calf compression, swelling, warmth, erythema  Assessment: Status post exostectomy right 4th and 5th toes.  Plan: -All treatment options discussed with the patient including all alternatives, risks, complications.  -X-rays were obtained and reviewed.  Multiple views were obtained.  There is no evidence of acute fracture.  Initial x-rays of the foot there was a foreign object present in the fifth toe but upon repeat x-ray this was not visualized. -Incisions healing well.  Xeroform dressing.  She keep the dressing clean, dry, intact. -Remain in surgical shoe.  Ice, elevation, compression to only residual edema. -Discussed Jublia. I did have a sample I gave (exp 08/2024 Lot [id number]) -Patient encouraged to call the office with any questions, concerns, change in symptoms.   Vivi Barrack DPM

## 2024-04-02 ENCOUNTER — Encounter: Admitting: Podiatry

## 2024-04-02 ENCOUNTER — Ambulatory Visit (INDEPENDENT_AMBULATORY_CARE_PROVIDER_SITE_OTHER): Admitting: Podiatry

## 2024-04-02 ENCOUNTER — Encounter: Payer: Self-pay | Admitting: Podiatry

## 2024-04-02 DIAGNOSIS — M2041 Other hammer toe(s) (acquired), right foot: Secondary | ICD-10-CM

## 2024-04-05 NOTE — Progress Notes (Signed)
 Subjective: Chief Complaint  Patient presents with   Routine Post Op    RM#11 POST OP # 2 DOS 03/18/24 RIGHT EXOSTECTOMY 4TH/5TH TOES-Patient states is doing well has no concerns at this time.    58 year old female presents the office above concerns.  She presents today for possible suture removal.  States that she has been doing well her pain is controlled.  She is ready get out of the surgical shoe.  No fevers or chills.  No other concerns.    Objective: AAO x3, NAD DP/PT pulses palpable bilaterally, CRT less than 3 seconds To the right 4th and 5th toes sutures are intact without any evidence of dehiscence.  There is no surrounding erythema, ascending cellulitis.  No drainage or pus.  No fluctuation or crepitation.  There is no malodor. No pain with calf compression, swelling, warmth, erythema  Assessment: Status post exostectomy right 4th and 5th toes.  Plan: -All treatment options discussed with the patient including all alternatives, risks, complications.  -Sutures removed without complications incisions healing well.  Steri-Strip applied for reinforcement followed by antibiotic ointment and dressing.  She can wash the dose of water, dry thoroughly apply a similar bandage.  She can gradually start to transition back to regular shoe as tolerated.  Ice, elevation. -Monitor for any clinical signs or symptoms of infection and directed to call the office immediately should any occur or go to the ER.  RTC as scheduled, or sooner if needed  Charity Conch DPM

## 2024-04-16 ENCOUNTER — Encounter: Admitting: Podiatry

## 2024-05-08 ENCOUNTER — Other Ambulatory Visit (HOSPITAL_BASED_OUTPATIENT_CLINIC_OR_DEPARTMENT_OTHER): Payer: Self-pay | Admitting: Obstetrics and Gynecology

## 2024-05-08 DIAGNOSIS — Z13820 Encounter for screening for osteoporosis: Secondary | ICD-10-CM

## 2024-05-20 ENCOUNTER — Other Ambulatory Visit: Payer: Self-pay | Admitting: Podiatry

## 2024-05-20 ENCOUNTER — Telehealth: Payer: Self-pay | Admitting: Podiatry

## 2024-05-20 MED ORDER — EFINACONAZOLE 10 % EX SOLN: 1.0000 [drp] | Freq: Every day | CUTANEOUS | 11 refills | Status: AC

## 2024-05-20 NOTE — Telephone Encounter (Signed)
 Patient was calling about a paint on cream and she was suppose to see how it works. Patient is reaching out to see if she can get a refill of this she uses [facility] in [place].

## 2024-05-21 ENCOUNTER — Ambulatory Visit (HOSPITAL_BASED_OUTPATIENT_CLINIC_OR_DEPARTMENT_OTHER)

## 2024-05-22 ENCOUNTER — Ambulatory Visit
Admission: RE | Admit: 2024-05-22 | Discharge: 2024-05-22 | Disposition: A | Discharge status: Home / Self Care | Source: Ambulatory Visit | Attending: Obstetrics and Gynecology | Admitting: Obstetrics and Gynecology

## 2024-05-22 ENCOUNTER — Ambulatory Visit

## 2024-05-22 DIAGNOSIS — Z1231 Encounter for screening mammogram for malignant neoplasm of breast: Secondary | ICD-10-CM

## 2024-06-17 NOTE — Telephone Encounter (Signed)
 Pt returning your call

## 2024-07-06 NOTE — Telephone Encounter (Signed)
 Fax received from pharmacy asking for refill of Hydroxyzine. Last ov 01/2023 with nothing scheduled currently. Will fill for a short period and advise pharmacy to notify pt she needs appt for further refills.

## 2024-07-08 NOTE — Progress Notes (Signed)
 Chief concern/reason for visit: Follow-up (She has abdominal pain again since July 1st. Pain right below sternum (dull). There is a central abdominal pain that feels like a stomach ache all the time. In last 2 months when she eats certain things she starts hiccupping while eating (after the first swallow). Has problems with yeast and thrush. Feels like her mouth is raw and hurts to eat everything. When she gets thrush she starts to feel the pain in her stomach. Wants to maybe be tested for food allergies because of this. )   PCP: Onita Norleen Sharper, MD  Assessment/Plan:  Diagnoses and all orders for this visit:  Abdominal pain, unspecified abdominal location Assessment & Plan: Abdominal pain, unspecified   She experiences chronic pain below the sternum, constant and unrelieved by food or medication. The differential diagnosis includes hiatal hernia, gallbladder issues, and esophageal yeast infection. A previous endoscopy revealed a small sliding hiatal hernia. Recent burping suggests delayed gastric emptying. Order a gallbladder ultrasound to check for stones or sludge. Schedule an upper endoscopy to evaluate the hiatal hernia, esophageal yeast, ulcers, and H. pylori. Consider a gastric emptying study if symptoms persist.  Orders: -     US  RUQ W Gallbladder -     NM Gastric Emptying; Future    Assessment & Plan Abdominal pain, unspecified   She experiences chronic pain below the sternum, constant and unrelieved by food or medication. The differential diagnosis includes hiatal hernia, gallbladder issues, and esophageal yeast infection. A previous endoscopy revealed a small sliding hiatal hernia. Recent burping suggests delayed gastric emptying. Order a gallbladder ultrasound to check for stones or sludge. Schedule an upper endoscopy to evaluate the hiatal hernia, esophageal yeast, ulcers, and H. pylori. Consider a gastric emptying study if symptoms persist.  Hiatal hernia   A small sliding  hiatal hernia was previously diagnosed and may contribute to abdominal pain and hiccups due to diaphragm irritation. Evaluate the hiatal hernia during the scheduled upper endoscopy for any changes in size or severity.  Systemic candidiasis infection   She has systemic candidiasis with recurrent oral thrush and abdominal pain. Previous fluconazole  treatment was ineffective. Awaiting a referral to an infectious disease specialist for management.  Sjogren's syndrome   She has Sjogren's syndrome with oral symptoms. A recent ENT evaluation did not link symptoms to a Sjogren's exacerbation. The compounding pharmacy mouthwash was intolerable. Consult with ENT for ongoing oral symptoms and alternative treatments.    No follow-ups on file.  The patient consented to use of [employer] services.    Subjective  History of Present Illness: History of Present Illness Kelly Garcia is a 58 year old female with IBS and Sjogren's syndrome who presents with abdominal pain and oral discomfort. She has been referred to an infectious disease specialist in [facility] for further evaluation of her oral symptoms.  She experiences constant, aching abdominal pain around the sternum since the beginning of the month. Increasing Carafate to four times daily and using antacids have not provided relief. A sublingual medication for stomach cramping sometimes helps. The pain is unaffected by eating and is occasionally accompanied by a heartbeat sensation in the stomach. She denies nausea, vomiting, or blood in her stool. She has regular bowel movements every day or every other day, consistent with her IBS with diarrhea.  Oral discomfort includes rawness and pain in the mouth, affecting the cheeks, lips, tongue, roof of the mouth, and gums. Cold foods do not cause pain. She has a history of thrush  and has been on a long regimen of Diflucan  without symptom resolution.  She has a history of hiatal hernia and underwent  an upper endoscopy over a year ago. A family history of gallbladder issues is noted, with previous evaluations including a HIDA scan.  She experiences occasional hiccups, particularly when eating, and has noticed burping with a taste sensation recently. There are no recent changes in her diet or new medications around the time her symptoms began.   Review of Systems: Review of Systems   Medications:  Current Medications[1]   Past Medical History: Past Medical History[2]  Problem List:  Problem List[3]   Family History: Family History[4]   Surgical History: Past Surgical History[5]   Social History: Social History [6]   Allergies: Buprenorphine hcl, Morphine, Sulfa (sulfonamide antibiotics), Sulfamethoxazole, Cephalexin , Benzoin, Codeine, Doxycycline, Hydroxychloroquine, and Iodinated contrast media  Objective   Physical Exam Vitals reviewed.  Constitutional:      General: She is not in acute distress.    Appearance: Normal appearance. She is not ill-appearing or toxic-appearing.  HENT:     Head: Normocephalic and atraumatic.     Nose: Nose normal.     Mouth/Throat:     Mouth: Mucous membranes are dry.  Eyes:     General: No scleral icterus.    Conjunctiva/sclera: Conjunctivae normal.  Pulmonary:     Effort: Pulmonary effort is normal.  Abdominal:     General: Abdomen is flat. Bowel sounds are normal. There is no distension.     Palpations: Abdomen is soft. There is no mass.     Tenderness: There is abdominal tenderness. There is no guarding or rebound.     Hernia: No hernia is present.  Musculoskeletal:        General: Normal range of motion.     Cervical back: Normal range of motion. No rigidity.  Skin:    General: Skin is warm.     Coloration: Skin is not jaundiced or pale.     Findings: No erythema or rash.  Neurological:     General: No focal deficit present.     Mental Status: She is alert and oriented to person, place, and time. Mental status is at  baseline.  Psychiatric:        Mood and Affect: Mood normal.        Behavior: Behavior normal.        Thought Content: Thought content normal.        Judgment: Judgment normal.      BP 144/88 (BP Site: L Arm, BP Position: Sitting, BP Cuff Size: Small)   Pulse 95   Ht 157.5 cm (5' 2)   Wt 59.4 kg (131 lb)   SpO2 98%   BMI 23.96 kg/m            [1] Current Outpatient Medications  Medication Sig Dispense Refill  . aspirin (ECOTRIN) 81 MG tablet Take 1 tablet (81 mg total) by mouth daily.    . cholecalciferol, vitamin D3-50 mcg, 2,000 unit,, 50 mcg (2,000 unit) tablet Take 1 tablet (50 mcg total) by mouth daily.    . famotidine (PEPCID) 40 MG tablet Take 1 tablet (40 mg total) by mouth daily.    . fluconazole  (DIFLUCAN ) 150 MG tablet Take 1 tablet (150 mg total) by mouth daily as needed.    . HYDROcodone -acetaminophen  (NORCO 10-325) 10-325 mg per tablet Take 1 tablet by mouth every six (6) hours as needed for pain.    . hydrOXYzine (ATARAX) 50  MG tablet Take 2 tablets (100 mg total) by mouth nightly.    . hyoscyamine (LEVSIN/SL) 0.125 mg SL tablet Place 1 tablet (0.125 mg total) under the tongue every four (4) hours as needed for cramping. 60 tablet 4  . leflunomide (ARAVA) 10 MG tablet Take 1 tablet (10 mg total) by mouth daily.    SABRA LORazepam (ATIVAN) 1 MG tablet Take 1 tablet (1 mg total) by mouth two (2) times a day. Take 1 tablet (1 mg total) by mouth in the morning and 1 tablet (1 mg total) in the evening    . methen-sod phos-meth blue-hyos (UROGESIC-BLUE) 81.6-40.8-0.12 mg Tab Take 1 tablet by mouth two (2) times a day.    . metroNIDAZOLE (METROCREAM) 0.75 % cream Apply topically daily as needed.    . naloxone (NARCAN) 4 mg nasal spray 1 spray into alternating nostrils once as needed (overdose).    . ondansetron  (ZOFRAN ) 4 MG tablet TAKE 1 TABLET BY MOUTH EVERY 8 HOURS AS NEEDED FOR UP TO 7 DAYS FOR NAUSEA / VOMITING.    . oxyCODONE  (ROXICODONE ) 5 MG immediate release  tablet (Schedule II Drug) Oral for 4    . oxyCODONE -acetaminophen  (PERCOCET) 5-325 mg per tablet Take 1 tablet by mouth every six (6) hours as needed for pain.    . pantoprazole (PROTONIX) 40 MG tablet Take 1 tablet (40 mg total) by mouth two (2) times a day. 180 tablet 3  . pilocarpine (SALAGEN) 5 MG tablet Take 1 tablet (5 mg total) by mouth Three (3) times a day.    . promethazine  (PHENERGAN ) 25 MG suppository daily as needed.    . sucralfate (CARAFATE) 1 gram tablet Take 1 tablet (1 g total) by mouth four (4) times a day. Two to four times per day    . traMADol  (ULTRAM ) 50 mg tablet Take 1 tablet (50 mg total) by mouth every eight (8) hours as needed for pain.    . acetaminophen  (TYLENOL ) 325 MG tablet Take 2 tablets (650 mg total) by mouth every six (6) hours as needed for pain. (Patient not taking: Reported on 07/08/2024)    . levomefolate-B12-herb 236 (RHEUMATE) 1-1-500 mg cap  (Patient not taking: Reported on 07/08/2024)     No current facility-administered medications for this visit.  [2] Past Medical History: Diagnosis Date  . Anemia   . Anxiety   . Arthritis   . Carpal tunnel syndrome 09/23/2022  . Chest pain 08/22/2023  . Chronic fatigue syndrome 02/04/2013  . Chronic interstitial cystitis 09/29/2011  . Chronic pain 08/18/2019  . Depression   . Depressive disorder 08/18/2019  . Dyslipidemia 02/11/2018  . Fibromyalgia   . Foreign body in digestive system 10/01/2023  . GERD (gastroesophageal reflux disease)   . Hematuria 12/26/2013  . History of DVT (deep vein thrombosis) 02/11/2018   Formatting of this note might be different from the original.  Pelvic vein DVT    . Hyperlipidemia   . Hypertension   . Hypertensive disorder 12/28/2021  . IBS (irritable bowel syndrome)   . IC (interstitial cystitis)   . Immune deficiency disorder (HHS-HCC)   . Increased frequency of urination 09/29/2011  . Injury of plantar plate of right foot 01/17/2017  . Iron deficiency   . Lumbar  spondylosis 09/23/2022  . NAFLD (nonalcoholic fatty liver disease)   . Neuroma 01/17/2017  . Osteoporosis 12/28/2021  . Panic attacks 02/04/2013  . PONV (postoperative nausea and vomiting)   . Posttraumatic stress disorder 08/18/2019  . Primary  osteoarthritis involving multiple joints 12/13/2022  . Renal disorder    ((Pt Qnr Sub: patient does have kidney disease))   . Rheumatoid arthritis    02/11/2018  . Sjogren syndrome (HHS-HCC)   . Sjogrens syndrome (HHS-HCC) 12/13/2022  . Tarsal tunnel syndrome of left side 09/23/2022  . Xerostomia 04/30/2018  [3] Patient Active Problem List Diagnosis  . Anxiety  . Carpal tunnel syndrome  . Chronic fatigue syndrome  . Chronic interstitial cystitis  . Depressive disorder  . Disorder of bladder  . Dyslipidemia  . Chronic pain  . Fibromyalgia  . Chronic pain  . Hypertensive disorder  . Iron deficiency anemia  . Lumbar spondylosis  . NAFLD (nonalcoholic fatty liver disease)  . Neuroma  . Osteoporosis  . Panic attacks  . Posttraumatic stress disorder  . Primary osteoarthritis involving multiple joints  . Rheumatoid arthritis     . Sjogrens syndrome (HHS-HCC)  . Tarsal tunnel syndrome of left side  . Ureteral stenosis  . Xerostomia  . Chest pain  . Abdominal pain  . Foreign body in digestive system  . Non-recurrent bilateral inguinal hernia without obstruction or gangrene  [4] Family History Problem Relation Age of Onset  . Diabetes Mother   . Thyroid  disease Mother   . Multiple myeloma Maternal Grandmother   . Skin cancer Maternal Grandfather   . Diabetes Maternal Aunt   . Thyroid  disease Maternal Aunt   . Pancreatic cancer Maternal Great-Grandmother   . GI problems Paternal Uncle   [5] Past Surgical History: Procedure Laterality Date  . ANKLE SURGERY    . CESAREAN SECTION     X 2  . COLONOSCOPY  06/18/2023   [facility]  . COMBINED REDUCTION MAMMAPLASTY W/ LIPOSUCTION    . CYSTOURETHROSCOPY     RECURRENT, TWICE A YEAR SINCE  AGE 58  . DENTAL SURGERY    . ESOPHAGOGASTRODUODENOSCOPY  06/18/2023   [facility]  . FOOT SURGERY     W/ HARDWARE  . KNEE ARTHROSCOPY W/ ACL RECONSTRUCTION Right    X 3  . KNEE ARTHROSCOPY W/ ACL RECONSTRUCTION Left    X 2  . KNEE CARTILAGE SURGERY Right    MENISCUS  . OTHER SURGICAL HISTORY     Multiple see list in PMH  . PR COLONOSCOPY FLX DX W/COLLJ SPEC WHEN PFRMD N/A 11/01/2023   Procedure: Colonoscopy;  Surgeon: Rogena Hacker, MD;  Location: ENDO OR [facility];  Service: Gastroenterology  . PR LAP,INGUINAL HERNIA REPR,INITIAL Bilateral 01/09/2024   Procedure: Robotic assisted laparoscopic bilateral inguinal hernia repair with mesh;  Surgeon: Fleeta Der Venita Deutscher, MD;  Location: OR [facility];  Service: General Surgery  . SINUS SURGERY    . TONSILLECTOMY    [6] Social History Socioeconomic History  . Marital status: Single  Tobacco Use  . Smoking status: Never    Passive exposure: Past  . Smokeless tobacco: Never  Vaping Use  . Vaping status: Never Used  Substance and Sexual Activity  . Alcohol  use: Not Currently    Comment: Only twice a year  . Drug use: Never  . Sexual activity: Never   Social Drivers of Corporate investment banker Strain: Low Risk  (01/03/2024)   Overall Financial Resource Strain (CARDIA)   . Difficulty of Paying Living Expenses: Not very hard  Food Insecurity: No Food Insecurity (01/03/2024)   Hunger Vital Sign   . Worried About Programme researcher, broadcasting/film/video in the Last Year: Never true   . Ran Out of Food in the Last  Year: Never true  Transportation Needs: No Transportation Needs (01/03/2024)   PRAPARE - Transportation   . Lack of Transportation (Medical): No   . Lack of Transportation (Non-Medical): No   Received from [employer]   Social Network  Housing: Low Risk  (01/03/2024)   Housing   . Within the past 12 months, have you ever stayed: outside, in a car, in a tent, in an overnight shelter, or temporarily in someone else's home (i.e. couch-surfing)?: No    . Are you worried about losing your housing?: No

## 2024-09-24 ENCOUNTER — Telehealth: Payer: Self-pay | Admitting: Podiatry

## 2024-09-24 NOTE — Telephone Encounter (Signed)
 Patient called stating that she hasn't seen any improvements with using the antifungal topical. She would like to be prescribed something different.

## 2024-10-16 ENCOUNTER — Telehealth: Payer: Self-pay | Admitting: Podiatry

## 2024-11-16 ENCOUNTER — Telehealth: Payer: Self-pay | Admitting: Podiatry

## 2024-11-16 NOTE — Telephone Encounter (Signed)
 Patient called saying she has been trying to contact the Medical Records department since October via phone, fax and email to get her medical records from our office sent to the podiatry office she is trying to be seen with where she has moved and they still have not sent the medical records. She stated she was scheduled to be seen by the new podiatrist today but the appointment was cancelled by their office due to not having her medical records.

## 2024-11-16 NOTE — Telephone Encounter (Signed)
 Called patient back regarding her call on status of her Medical Records request. I let patient know that we are e-mailing the [facility] HIM department and CC 'ing my lead, Shelly and Research Officer, Political Party [employer] on the e-mail for the HIM department to get patient's records released. I gave patient Shelly's work e-mail for her to e-mail all of her correspondence with the HIM department regarding this request. Patient stated she only needs her last two surgical op reports and office visit notes corresponding those surgeries. Patient asked who will receive her medical records request and I verified with patient that she sent that information to the HIM department. I told her we would put in the e-mail to them for them to contact her with any questions regarding that.
